# Patient Record
Sex: Female | Born: 1990 | Race: Black or African American | Hispanic: No | Marital: Married | Smoking: Never smoker
Health system: Southern US, Community
[De-identification: ages and names within clinical notes are randomized; demographics above are authoritative.]

## PROBLEM LIST (undated history)

## (undated) DIAGNOSIS — N12 Tubulo-interstitial nephritis, not specified as acute or chronic: Secondary | ICD-10-CM

## (undated) HISTORY — PX: NO PAST SURGERIES: SHX2092

## (undated) HISTORY — DX: Tubulo-interstitial nephritis, not specified as acute or chronic: N12

---

## 2015-12-27 DIAGNOSIS — A6000 Herpesviral infection of urogenital system, unspecified: Secondary | ICD-10-CM | POA: Insufficient documentation

## 2015-12-27 LAB — HM PAP SMEAR: HM Pap smear: NEGATIVE

## 2016-01-03 LAB — HEPATIC FUNCTION PANEL
ALK PHOS: 97 U/L (ref 25–125)
ALT: 24 U/L (ref 7–35)
AST: 19 U/L (ref 13–35)
BILIRUBIN, TOTAL: 0.5 mg/dL

## 2016-01-03 LAB — BASIC METABOLIC PANEL
BUN: 7 mg/dL (ref 4–21)
CREATININE: 0.8 mg/dL (ref 0.5–1.1)
Glucose: 89 mg/dL
POTASSIUM: 4.3 mmol/L (ref 3.4–5.3)
Sodium: 138 mmol/L (ref 137–147)

## 2016-01-03 LAB — CBC AND DIFFERENTIAL
HEMATOCRIT: 41 % (ref 36–46)
HEMOGLOBIN: 13.8 g/dL (ref 12.0–16.0)
Platelets: 323 10*3/uL (ref 150–399)
WBC: 8.7 10*3/mL

## 2016-01-03 LAB — LIPID PANEL
CHOLESTEROL: 223 mg/dL — AB (ref 0–200)
HDL: 55 mg/dL (ref 35–70)
LDL Cholesterol: 151 mg/dL
TRIGLYCERIDES: 84 mg/dL (ref 40–160)

## 2016-01-03 LAB — TSH: TSH: 0.76 u[IU]/mL (ref 0.41–5.90)

## 2017-01-23 ENCOUNTER — Telehealth: Payer: Self-pay | Admitting: *Deleted

## 2017-01-23 ENCOUNTER — Encounter: Payer: Self-pay | Admitting: *Deleted

## 2017-01-23 NOTE — Telephone Encounter (Signed)
PreVisit call completed. Pt states she would like to discuss increase in fatigue, dry skin, mood changes, increased appetite and a 20-30 lb weight gain. Pt is taking birth control, she will bring in medicine bottle tomorrow. She is not interested in receiving the flu shot. Next pap smear in 02/07/2017.

## 2017-01-24 ENCOUNTER — Ambulatory Visit (INDEPENDENT_AMBULATORY_CARE_PROVIDER_SITE_OTHER): Payer: Commercial Managed Care - HMO | Admitting: Physician Assistant

## 2017-01-24 ENCOUNTER — Encounter: Payer: Self-pay | Admitting: Physician Assistant

## 2017-01-24 VITALS — BP 110/76 | HR 70 | Temp 98.6°F | Ht 64.0 in | Wt 229.4 lb

## 2017-01-24 DIAGNOSIS — Z114 Encounter for screening for human immunodeficiency virus [HIV]: Secondary | ICD-10-CM | POA: Diagnosis not present

## 2017-01-24 DIAGNOSIS — Z23 Encounter for immunization: Secondary | ICD-10-CM | POA: Diagnosis not present

## 2017-01-24 DIAGNOSIS — R5383 Other fatigue: Secondary | ICD-10-CM

## 2017-01-24 LAB — CBC WITH DIFFERENTIAL/PLATELET
Basophils Absolute: 0 10*3/uL (ref 0.0–0.1)
Basophils Relative: 0.3 % (ref 0.0–3.0)
EOS PCT: 2.1 % (ref 0.0–5.0)
Eosinophils Absolute: 0.2 10*3/uL (ref 0.0–0.7)
HEMATOCRIT: 41.7 % (ref 36.0–46.0)
Hemoglobin: 13.8 g/dL (ref 12.0–15.0)
LYMPHS ABS: 2.8 10*3/uL (ref 0.7–4.0)
Lymphocytes Relative: 34.6 % (ref 12.0–46.0)
MCHC: 33.1 g/dL (ref 30.0–36.0)
MCV: 85.3 fl (ref 78.0–100.0)
MONOS PCT: 6.8 % (ref 3.0–12.0)
Monocytes Absolute: 0.5 10*3/uL (ref 0.1–1.0)
NEUTROS ABS: 4.5 10*3/uL (ref 1.4–7.7)
NEUTROS PCT: 56.2 % (ref 43.0–77.0)
PLATELETS: 336 10*3/uL (ref 150.0–400.0)
RBC: 4.89 Mil/uL (ref 3.87–5.11)
RDW: 13.1 % (ref 11.5–15.5)
WBC: 8 10*3/uL (ref 4.0–10.5)

## 2017-01-24 LAB — COMPREHENSIVE METABOLIC PANEL
ALK PHOS: 80 U/L (ref 39–117)
ALT: 20 U/L (ref 0–35)
AST: 15 U/L (ref 0–37)
Albumin: 3.8 g/dL (ref 3.5–5.2)
BILIRUBIN TOTAL: 0.5 mg/dL (ref 0.2–1.2)
BUN: 12 mg/dL (ref 6–23)
CALCIUM: 9.4 mg/dL (ref 8.4–10.5)
CO2: 29 mEq/L (ref 19–32)
Chloride: 104 mEq/L (ref 96–112)
Creatinine, Ser: 0.77 mg/dL (ref 0.40–1.20)
GFR: 115.89 mL/min (ref >60.00)
GLUCOSE: 83 mg/dL (ref 70–99)
POTASSIUM: 3.9 meq/L (ref 3.5–5.1)
Sodium: 137 mEq/L (ref 135–145)
TOTAL PROTEIN: 7.5 g/dL (ref 6.0–8.3)

## 2017-01-24 LAB — SEDIMENTATION RATE: SED RATE: 12 mm/h (ref 0–20)

## 2017-01-24 LAB — T4, FREE: Free T4: 0.79 ng/dL (ref 0.60–1.60)

## 2017-01-24 LAB — VITAMIN B12: VITAMIN B 12: 886 pg/mL (ref 211–911)

## 2017-01-24 LAB — POCT URINE PREGNANCY: PREG TEST UR: NEGATIVE

## 2017-01-24 LAB — VITAMIN D 25 HYDROXY (VIT D DEFICIENCY, FRACTURES): VITD: 28.54 ng/mL — ABNORMAL LOW (ref 30.00–100.00)

## 2017-01-24 LAB — TSH: TSH: 0.91 u[IU]/mL (ref 0.35–4.50)

## 2017-01-24 NOTE — Patient Instructions (Signed)
It was great meeting you today!  Please complete your lab work. We will call you with your lab results.   Fatigue Fatigue is feeling tired all of the time, a lack of energy, or a lack of motivation. Occasional or mild fatigue is often a normal response to activity or life in general. However, long-lasting (chronic) or extreme fatigue may indicate an underlying medical condition. Follow these instructions at home: Watch your fatigue for any changes. The following actions may help to lessen any discomfort you are feeling:  Talk to your health care provider about how much sleep you need each night. Try to get the required amount every night.  Take medicines only as directed by your health care provider.  Eat a healthy and nutritious diet. Ask your health care provider if you need help changing your diet.  Drink enough fluid to keep your urine clear or pale yellow.  Practice ways of relaxing, such as yoga, meditation, massage therapy, or acupuncture.  Exercise regularly.  Change situations that cause you stress. Try to keep your work and personal routine reasonable.  Do not abuse illegal drugs.  Limit alcohol intake to no more than 1 drink per day for nonpregnant women and 2 drinks per day for men. One drink equals 12 ounces of beer, 5 ounces of wine, or 1 ounces of hard liquor.  Take a multivitamin, if directed by your health care provider. Contact a health care provider if:  Your fatigue does not get better.  You have a fever.  You have unintentional weight loss or gain.  You have headaches.  You have difficulty:  Falling asleep.  Sleeping throughout the night.  You feel angry, guilty, anxious, or sad.  You are unable to have a bowel movement (constipation).  You skin is dry.  Your legs or another part of your body is swollen. Get help right away if:  You feel confused.  Your vision is blurry.  You feel faint or pass out.  You have a severe headache.  You  have severe abdominal, pelvic, or back pain.  You have chest pain, shortness of breath, or an irregular or fast heartbeat.  You are unable to urinate or you urinate less than normal.  You develop abnormal bleeding, such as bleeding from the rectum, vagina, nose, lungs, or nipples.  You vomit blood.  You have thoughts about harming yourself or committing suicide.  You are worried that you might harm someone else. This information is not intended to replace advice given to you by your health care provider. Make sure you discuss any questions you have with your health care provider. Document Released: 09/02/2007 Document Revised: 04/12/2016 Document Reviewed: 03/09/2014 Elsevier Interactive Patient Education  2017 [employer]

## 2017-01-24 NOTE — Progress Notes (Signed)
Pre visit review using our clinic review tool, if applicable. No additional management support is needed unless otherwise documented below in the visit note. 

## 2017-01-24 NOTE — Progress Notes (Signed)
Subjective:    Patient ID: Kelsey Curtis, female    DOB: Mar 29, 1991, 26 y.o.   MRN: [id number]  HPI  Kelsey Curtis is a 26 y/o female who presents to clinic today to establish care.  Acute Concerns: Fatigue - Over the past 2 months, patient has had increased fatigue, dry skin, mood changes, increased appetite, and changes in her period. She has been getting adequate sleep, which is 6-7 hours per night for her. She denies any snoring or concerns for sleep apnea. Her caffeine intake is very limited and she does not drink any coffee or sodas with caffeine does have occasional chocolate. For the past 2 months her periods have been extremely light requiring only use a panty liner and lasts only 2-3 days at this point. She has a history of having heavy and painful periods however she has been on Junel birth control for 7 or 8 years and this has relieved this. She did a pregnancy test at home yesterday and it was negative. She does work full-time for the [employer] and is in school to become a Social worker. She denies depression however she does endorse significant anxiety with her work life responsibilities including her job and school. Her diet is well balanced she needs MRI of foods, and drinks at least 324 ounces water a day. She has noticed that her skin is very dry despite ongoing application of lotion. She and her fianc has noticed that over the past 2 months she has been emotionally labile including increased tearfulness and irritability. She has a history of low back pain and this has been treated in the past by her chiropractor. She says she has ongoing diffuse body aches. She has had weight gain of approximately 30 pounds since 1 year. She denies any changes in heat or cold intolerance. She is due to see her gynecologist at the end of the month. She does endorse frequent urination however she took this to being well hydrated.  Review of Systems  See HPI  Past Medical History:  Diagnosis  Date  . Genital herpes 2015   Pt has been on Acyclovir     Social History   Social History  . Marital status: Single    Spouse name: N/A  . Number of children: N/A  . Years of education: N/A   Occupational History  . Not on file.   Social History Main Topics  . Smoking status: Never Smoker  . Smokeless tobacco: Never Used  . Alcohol use Yes     Comment: Occ.  . Drug use: No  . Sexual activity: Yes    Birth control/ protection: Pill   Other Topics Concern  . 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 No pertinent surgical history.  Family History  Problem Relation Age of Onset  . Alzheimer's disease Maternal Grandmother     No Known Allergies  No current outpatient prescriptions on file prior to visit.   No current facility-administered medications on file prior to visit.     BP 110/76 (BP Location: Left Arm, Patient Position: Sitting, Cuff Size: Large)   Pulse 70   Temp 98.6 F (37 C) (Oral)   Ht 5' 4"  (1.626 m)   Wt 229 lb 6.1 oz (104 kg)  LMP 01/22/2017   SpO2 99%   BMI 39.37 kg/m      Objective:   Physical Exam  Constitutional: She appears well-developed and well-nourished. She is cooperative.  Non-toxic appearance. She does not have a sickly appearance. She does not appear ill. No distress.  HENT:  Head: Normocephalic and atraumatic.  Neck: Trachea normal and normal range of motion. No thyroid mass present.  Cardiovascular: Normal rate, regular rhythm and normal heart sounds.   Pulmonary/Chest: Effort normal and breath sounds normal. No accessory muscle usage. No respiratory distress.  Neurological: She is alert.  Skin: Skin is warm, dry and intact.  Psychiatric: She has a normal mood and affect. Her speech is normal.  Nursing note and vitals  reviewed.  Results for orders placed or performed in visit on 01/24/17  POCT urine pregnancy  Result Value Ref Range   Preg Test, Ur Negative Negative      Assessment & Plan:  1. Fatigue, unspecified type Urine pregnancy test negative. Order routine labs to check for organic causes of fatigue. I also encouraged patient to follow up with her OB/GYN at her regularly scheduled annual visit at the end of this month regarding these issues for further evaluation and treatment. - TSH - T4, free - CBC with Differential/Platelet - Comprehensive metabolic panel - VITAMIN D 25 Hydroxy (Vit-D Deficiency, Fractures) - POCT urine pregnancy - Sed Rate (ESR) - Vitamin B12  2. Encounter for screening for HIV Patient provided verbal consent for this one-time screening today. - HIV antibody (with reflex)  3. Need for prophylactic vaccination with combined diphtheria-tetanus-pertussis (DTP) vaccine Tdap administered today. - Tdap vaccine greater than or equal to 7yo IM  [employer] PA-C 01/24/17

## 2017-01-24 NOTE — Telephone Encounter (Signed)
Noted  

## 2017-01-25 LAB — HIV ANTIBODY (ROUTINE TESTING W REFLEX): HIV 1&2 Ab, 4th Generation: NONREACTIVE

## 2017-01-31 ENCOUNTER — Encounter: Payer: Self-pay | Admitting: Physician Assistant

## 2017-01-31 DIAGNOSIS — A6000 Herpesviral infection of urogenital system, unspecified: Secondary | ICD-10-CM

## 2017-01-31 LAB — CHLAMYDIA/GC NAA, CONFIRMATION
CHLAMYDIA BY NAA: NEGATIVE
Neisseria gonorrhoeae, NAA: NEGATIVE

## 2017-09-19 ENCOUNTER — Encounter: Payer: Self-pay | Admitting: Physician Assistant

## 2017-09-19 ENCOUNTER — Ambulatory Visit (INDEPENDENT_AMBULATORY_CARE_PROVIDER_SITE_OTHER): Payer: Commercial Managed Care - HMO | Admitting: Physician Assistant

## 2017-09-19 VITALS — BP 120/74 | HR 74 | Temp 98.2°F | Ht 64.0 in | Wt 217.5 lb

## 2017-09-19 DIAGNOSIS — E669 Obesity, unspecified: Secondary | ICD-10-CM

## 2017-09-19 DIAGNOSIS — Z136 Encounter for screening for cardiovascular disorders: Secondary | ICD-10-CM | POA: Diagnosis not present

## 2017-09-19 DIAGNOSIS — Z13220 Encounter for screening for lipoid disorders: Secondary | ICD-10-CM | POA: Diagnosis not present

## 2017-09-19 DIAGNOSIS — K5900 Constipation, unspecified: Secondary | ICD-10-CM

## 2017-09-19 DIAGNOSIS — M545 Low back pain, unspecified: Secondary | ICD-10-CM

## 2017-09-19 DIAGNOSIS — Z0001 Encounter for general adult medical examination with abnormal findings: Secondary | ICD-10-CM

## 2017-09-19 DIAGNOSIS — E559 Vitamin D deficiency, unspecified: Secondary | ICD-10-CM | POA: Diagnosis not present

## 2017-09-19 DIAGNOSIS — R350 Frequency of micturition: Secondary | ICD-10-CM | POA: Diagnosis not present

## 2017-09-19 LAB — LIPID PANEL
CHOL/HDL RATIO: 4
Cholesterol: 202 mg/dL — ABNORMAL HIGH (ref 0–200)
HDL: 56.1 mg/dL (ref >39.00)
LDL CALC: 131 mg/dL — AB (ref 0–99)
NONHDL: 145.74
Triglycerides: 75 mg/dL (ref 0.0–149.0)
VLDL: 15 mg/dL (ref 0.0–40.0)

## 2017-09-19 LAB — COMPREHENSIVE METABOLIC PANEL
ALT: 15 U/L (ref 0–35)
AST: 10 U/L (ref 0–37)
Albumin: 3.6 g/dL (ref 3.5–5.2)
Alkaline Phosphatase: 62 U/L (ref 39–117)
BILIRUBIN TOTAL: 0.4 mg/dL (ref 0.2–1.2)
BUN: 14 mg/dL (ref 6–23)
CO2: 30 meq/L (ref 19–32)
CREATININE: 0.79 mg/dL (ref 0.40–1.20)
Calcium: 8.9 mg/dL (ref 8.4–10.5)
Chloride: 106 mEq/L (ref 96–112)
GFR: 111.97 mL/min (ref >60.00)
GLUCOSE: 82 mg/dL (ref 70–99)
Potassium: 4.5 mEq/L (ref 3.5–5.1)
SODIUM: 140 meq/L (ref 135–145)
Total Protein: 6.8 g/dL (ref 6.0–8.3)

## 2017-09-19 LAB — POCT URINALYSIS DIPSTICK
Bilirubin, UA: NEGATIVE
Blood, UA: NEGATIVE
Glucose, UA: NEGATIVE
KETONES UA: NEGATIVE
Nitrite, UA: NEGATIVE
PH UA: 6 (ref 5.0–8.0)
SPEC GRAV UA: 1.025 (ref 1.010–1.025)
Urobilinogen, UA: 1 E.U./dL

## 2017-09-19 LAB — CBC WITH DIFFERENTIAL/PLATELET
BASOS ABS: 0 10*3/uL (ref 0.0–0.1)
Basophils Relative: 0.2 % (ref 0.0–3.0)
EOS ABS: 0.2 10*3/uL (ref 0.0–0.7)
Eosinophils Relative: 1.6 % (ref 0.0–5.0)
HCT: 42.9 % (ref 36.0–46.0)
Hemoglobin: 14 g/dL (ref 12.0–15.0)
LYMPHS ABS: 3.1 10*3/uL (ref 0.7–4.0)
LYMPHS PCT: 31 % (ref 12.0–46.0)
MCHC: 32.5 g/dL (ref 30.0–36.0)
MCV: 87.3 fl (ref 78.0–100.0)
MONO ABS: 0.7 10*3/uL (ref 0.1–1.0)
Monocytes Relative: 7.2 % (ref 3.0–12.0)
NEUTROS ABS: 6 10*3/uL (ref 1.4–7.7)
NEUTROS PCT: 60 % (ref 43.0–77.0)
PLATELETS: 315 10*3/uL (ref 150.0–400.0)
RBC: 4.92 Mil/uL (ref 3.87–5.11)
RDW: 13.2 % (ref 11.5–15.5)
WBC: 10 10*3/uL (ref 4.0–10.5)

## 2017-09-19 LAB — HEMOGLOBIN A1C: Hgb A1c MFr Bld: 5.3 % (ref 4.6–6.5)

## 2017-09-19 LAB — TSH: TSH: 1.64 u[IU]/mL (ref 0.35–4.50)

## 2017-09-19 LAB — VITAMIN D 25 HYDROXY (VIT D DEFICIENCY, FRACTURES): VITD: 22.05 ng/mL — AB (ref 30.00–100.00)

## 2017-09-19 LAB — POCT URINE PREGNANCY: Preg Test, Ur: NEGATIVE

## 2017-09-19 MED ORDER — NITROFURANTOIN MONOHYD MACRO 100 MG PO CAPS: 100.0000 mg | ORAL_CAPSULE | Freq: Two times a day (BID) | ORAL | 0 refills | Status: DC

## 2017-09-19 NOTE — Progress Notes (Signed)
I acted as a Neurosurgeon for [employer], PA-C Corky Mull, LPN  Subjective:    Kelsey Curtis is a 26 y.o. female and is here for a comprehensive physical exam.  HPI  There are no preventive care reminders to display for this patient.  Acute Concerns: Frequency of urination and low back pain -- "sharp pin and needle pain" in her L side that started a few days ago, feels similar to when she had kidney infection in July, having some nausea/vomiting/chills.   Chronic Issues: Obesity -- she is in school and works full time, has very limited time for exercise, however when she works she is on her feet for 12 hours a day. She has lost about 12 lb since I last saw her in March. Does have a family hx of DM in grandmother and aunt. Constipation -- deals with this on a intermittent basis. Denies blood in stool, last BM yesterday, has occasional hard stools, has not noticed any blood in stools or hemorrhoids, no pain with eating, appetite is slightly diminished. Does take prn laxatives that do provide relief. Vit D deficiency -- last checked in March and was 28, she took Vit D supplement for a while after dx  Health Maintenance: Immunizations -- denied flu shot Colonoscopy -- no family hx of colon cancer, start at age 35  Mammogram -- no family hx of breast cancer, start at age 70 PAP -- sees Ob-Gyn, requesting records Diet -- normally has a really good appetite (has been diminished with recent urinary pain) Sleep habits -- "it's okay" tries to get at least 6 hours Exercise -- none, but does have a very active job Weight -- Weight: 217 lb 8 oz (98.7 kg) ; was 229 lb  Mood -- no issues with mood disorder Last period -- Patient's last menstrual period was 09/10/2017. Period characteristics -- very light, 2-3 days; no pain Birth control -- currently on Junel FE  Depression screen PHQ 2/9 09/19/2017  Decreased Interest 0  Down, Depressed, Hopeless 0  PHQ - 2 Score 0   Other  providers/specialists: Ob-Gyn --> [facility]?   PMHx, SurgHx, SocialHx, Medications, and Allergies were reviewed in the Visit Navigator and updated as appropriate.   No past medical history on file.  No past surgical history on file.   Family History  Problem Relation Age of Onset  . Alzheimer's disease Maternal Grandmother   . Colon cancer Neg Hx   . Breast cancer Neg Hx     Social History  Substance Use Topics  . Smoking status: Never Smoker  . Smokeless tobacco: Never Used  . Alcohol use Yes     Comment: Occ.    Review of Systems:   Review of Systems  Constitutional: Negative for chills, fever, malaise/fatigue and weight loss.  HENT: Negative for hearing loss, sinus pain and sore throat.   Respiratory: Negative for cough and hemoptysis.   Cardiovascular: Negative for chest pain, palpitations, leg swelling and PND.  Gastrointestinal: Positive for constipation. Negative for abdominal pain, diarrhea, heartburn, nausea and vomiting.  Genitourinary: Positive for frequency. Negative for dysuria and urgency.  Musculoskeletal: Positive for back pain. Negative for myalgias and neck pain.  Skin: Negative for itching and rash.  Neurological: Negative for dizziness, tingling, seizures and headaches.  Endo/Heme/Allergies: Negative for polydipsia.  Psychiatric/Behavioral: Negative for depression. The patient is not nervous/anxious.       Objective:   BP 120/74 (BP Location: Left Arm, Patient Position: Sitting, Cuff Size: Large)  Pulse 74   Temp 98.2 F (36.8 C) (Oral)   Ht 5\' 4"  (1.626 m)   Wt 217 lb 8 oz (98.7 kg)   LMP 09/10/2017   SpO2 98%   BMI 37.33 kg/m   General Appearance:    Alert, cooperative, no distress, appears stated age  Head:    Normocephalic, without obvious abnormality, atraumatic  Eyes:    PERRL, conjunctiva/corneas clear, EOM's intact, fundi    benign, both eyes  Ears:    Normal TM's and external ear canals, both ears  Nose:   Nares normal,  septum midline, mucosa normal, no drainage    or sinus tenderness  Throat:   Lips, mucosa, and tongue normal; teeth and gums normal  Neck:   Supple, symmetrical, trachea midline, no adenopathy;    thyroid:  no enlargement/tenderness/nodules; no carotid   bruit or JVD  Back:     Symmetric, no curvature, ROM normal, no CVA tenderness  Lungs:     Clear to auscultation bilaterally, respirations unlabored  Chest Wall:    No tenderness or deformity   Heart:    Regular rate and rhythm, S1 and S2 normal, no murmur, rub   or gallop  Breast Exam:   Deferred -- sees Ob-Gyn  Abdomen:     Soft, non-tender, bowel sounds active all four quadrants,    no masses, no organomegaly; no rebound or guarding present; no tenderness with deep palpation, no CVA tenderness  Genitalia:    Deferred -- sees Ob-Gyn  Rectal:    Deferred -- sees Ob-Gyn  Extremities:   Extremities normal, atraumatic, no cyanosis or edema  Pulses:   2+ and symmetric all extremities  Skin:   Skin color, texture, turgor normal, no rashes or lesions  Lymph nodes:   Cervical, supraclavicular, and axillary nodes normal  Neurologic:   CNII-XII intact, normal strength, sensation and reflexes    throughout   Results for orders placed or performed in visit on 09/19/17  POCT urinalysis dipstick  Result Value Ref Range   Color, UA Yellow    Clarity, UA Clear    Glucose, UA Negative    Bilirubin, UA Negative    Ketones, UA Negative    Spec Grav, UA 1.025 1.010 - 1.025   Blood, UA Negative    pH, UA 6.0 5.0 - 8.0   Protein, UA 15 mg/dL    Urobilinogen, UA 1.0 0.2 or 1.0 E.U./dL   Nitrite, UA Negative    Leukocytes, UA Trace (A) Negative  POCT urine pregnancy  Result Value Ref Range   Preg Test, Ur Negative Negative    Assessment/Plan:   [name] was seen today for annual exam and llq and low back pain left side.  Diagnoses and all orders for this visit:  Encounter for general adult medical examination with abnormal findings Today  patient counseled on age appropriate routine health concerns for screening and prevention, each reviewed and up to date or declined. Immunizations reviewed and up to date or declined. Labs ordered and reviewed. Risk factors for depression reviewed and negative. Hearing function and visual acuity are intact. ADLs screened and addressed as needed. Functional ability and level of safety reviewed and appropriate. Education, counseling and referrals performed based on assessed risks today. Patient provided with a copy of personalized plan for preventive services. -     CBC with Differential/Platelet -     Comprehensive metabolic panel  Frequency of urination and Left-sided low back pain without sciatica, unspecified chronicity UA with trace  bacteria. Urine pregnancy is negative. Start Macrobid until culture returns. Hx of recent pyelo. Exam benign. I do suspect that her constipation is playing a role in these symptoms. Advised that patient follow-up if symptoms worsen or persist despite treatment. -     POCT urinalysis dipstick -     POCT urine pregnancy -     Urine Culture  Obesity, unspecified classification, unspecified obesity type, unspecified whether serious comorbidity present Check A1c given obesity and fam hx DM. Check TSH as well. She has limited time to exercise, encouraged as able. Work on hydration and increasing fiber via fresh fruits/veggies. -     Hemoglobin A1c -     TSH  Constipation, unspecified constipation type Recommend Miralax daily to help soften stools. We discussed water intake and fresh fruits/veggies. -     TSH  Encounter for lipid screening for cardiovascular disease -     Lipid panel  Vitamin D deficiency Has been taking supplement for a bit, but recently stopped, will re-check level today. -     VITAMIN D 25 Hydroxy (Vit-D Deficiency, Fractures)  Other orders -     nitrofurantoin, macrocrystal-monohydrate, (MACROBID) 100 MG capsule; Take 1 capsule (100 mg total)  by mouth 2 (two) times daily.    Well Adult Exam: Labs ordered: Yes. Patient counseling was done. See below for items discussed. Discussed the patient's BMI. The BMI BMI is not in the acceptable range; BMI management plan is completed Follow up as needed for acute illness.  Patient Counseling:   [x]     Nutrition: Stressed importance of moderation in sodium/caffeine intake, saturated fat and cholesterol, caloric balance, sufficient intake of fresh fruits, vegetables, fiber, calcium, iron, and 1 mg of folate supplement per day (for females capable of pregnancy).   [x]      Stressed the importance of regular exercise.    [x]     Substance Abuse: Discussed cessation/primary prevention of tobacco, alcohol, or other drug use; driving or other dangerous activities under the influence; availability of treatment for abuse.    [x]      Injury prevention: Discussed safety belts, safety helmets, smoke detector, smoking near bedding or upholstery.    [x]      Sexuality: Discussed sexually transmitted diseases, partner selection, use of condoms, avoidance of unintended pregnancy  and contraceptive alternatives.    [x]     Dental health: Discussed importance of regular tooth brushing, flossing, and dental visits.   [x]      Health maintenance and immunizations reviewed. Please refer to Health maintenance section.   CMA or LPN served as scribe during this visit. History, Physical, and Plan performed by medical provider. Documentation and orders reviewed and attested to.  Jarold MottoSamantha Lon Klippel, PA-C [facility]

## 2017-09-19 NOTE — Patient Instructions (Addendum)
It was great to see you!  Start the antibiotic to cover for your early UTI, I will notify you of the culture results when they return. If your pain worsens in any way, please seek medical attention.  Work on getting more fiber and water in [employer]. Consider taking 1 dose of Miralax daily to help with getting regular.  Health Maintenance, Female Adopting a healthy lifestyle and getting preventive care can go a long way to promote health and wellness. Talk with your health care provider about what schedule of regular examinations is right for you. This is a good chance for you to check in with your provider about disease prevention and staying healthy. In between checkups, there are plenty of things you can do on your own. Experts have done a lot of research about which lifestyle changes and preventive measures are most likely to keep you healthy. Ask your health care provider for more information. Weight and diet Eat a healthy diet  Be sure to include plenty of vegetables, fruits, low-fat dairy products, and lean protein.  Do not eat a lot of foods high in solid fats, added sugars, or salt.  Get regular exercise. This is one of the most important things you can do for your health. ? Most adults should exercise for at least 150 minutes each week. The exercise should increase your heart rate and make you sweat (moderate-intensity exercise). ? Most adults should also do strengthening exercises at least twice a week. This is in addition to the moderate-intensity exercise.  Maintain a healthy weight  Body mass index (BMI) is a measurement that can be used to identify possible weight problems. It estimates body fat based on height and weight. Your health care provider can help determine your BMI and help you achieve or maintain a healthy weight.  For females 16 years of age and older: ? A BMI below 18.5 is considered underweight. ? A BMI of 18.5 to 24.9 is normal. ? A BMI of 25 to 29.9 is  considered overweight. ? A BMI of 30 and above is considered obese.  Watch levels of cholesterol and blood lipids  You should start having your blood tested for lipids and cholesterol at 27 years of age, then have this test every 5 years.  You may need to have your cholesterol levels checked more often if: ? Your lipid or cholesterol levels are high. ? You are older than 27 years of age. ? You are at high risk for heart disease.  Cancer screening Lung Cancer  Lung cancer screening is recommended for adults 64-94 years old who are at high risk for lung cancer because of a history of smoking.  A yearly low-dose CT scan of the lungs is recommended for people who: ? Currently smoke. ? Have quit within the past 15 years. ? Have at least a 30-pack-year history of smoking. A pack year is smoking an average of one pack of cigarettes a day for 1 year.  Yearly screening should continue until it has been 15 years since you quit.  Yearly screening should stop if you develop a health problem that would prevent you from having lung cancer treatment.  Breast Cancer  Practice breast self-awareness. This means understanding how your breasts normally appear and feel.  It also means doing regular breast self-exams. Let your health care provider know about any changes, no matter how small.  If you are in your 20s or 30s, you should have a clinical breast exam (CBE)  by a health care provider every 1-3 years as part of a regular health exam.  If you are 40 or older, have a CBE every year. Also consider having a breast X-ray (mammogram) every year.  If you have a family history of breast cancer, talk to your health care provider about genetic screening.  If you are at high risk for breast cancer, talk to your health care provider about having an MRI and a mammogram every year.  Breast cancer gene (BRCA) assessment is recommended for women who have family members with BRCA-related cancers.  BRCA-related cancers include: ? Breast. ? Ovarian. ? Tubal. ? Peritoneal cancers.  Results of the assessment will determine the need for genetic counseling and BRCA1 and BRCA2 testing.  Cervical Cancer Your health care provider may recommend that you be screened regularly for cancer of the pelvic organs (ovaries, uterus, and vagina). This screening involves a pelvic examination, including checking for microscopic changes to the surface of your cervix (Pap test). You may be encouraged to have this screening done every 3 years, beginning at age 21.  For women ages 30-65, health care providers may recommend pelvic exams and Pap testing every 3 years, or they may recommend the Pap and pelvic exam, combined with testing for human papilloma virus (HPV), every 5 years. Some types of HPV increase your risk of cervical cancer. Testing for HPV may also be done on women of any age with unclear Pap test results.  Other health care providers may not recommend any screening for nonpregnant women who are considered low risk for pelvic cancer and who do not have symptoms. Ask your health care provider if a screening pelvic exam is right for you.  If you have had past treatment for cervical cancer or a condition that could lead to cancer, you need Pap tests and screening for cancer for at least 20 years after your treatment. If Pap tests have been discontinued, your risk factors (such as having a new sexual partner) need to be reassessed to determine if screening should resume. Some women have medical problems that increase the chance of getting cervical cancer. In these cases, your health care provider may recommend more frequent screening and Pap tests.  Colorectal Cancer  This type of cancer can be detected and often prevented.  Routine colorectal cancer screening usually begins at 27 years of age and continues through 27 years of age.  Your health care provider may recommend screening at an earlier age if  you have risk factors for colon cancer.  Your health care provider may also recommend using home test kits to check for hidden blood in the stool.  A small camera at the end of a tube can be used to examine your colon directly (sigmoidoscopy or colonoscopy). This is done to check for the earliest forms of colorectal cancer.  Routine screening usually begins at age 50.  Direct examination of the colon should be repeated every 5-10 years through 27 years of age. However, you may need to be screened more often if early forms of precancerous polyps or small growths are found.  Skin Cancer  Check your skin from head to toe regularly.  Tell your health care provider about any new moles or changes in moles, especially if there is a change in a mole's shape or color.  Also tell your health care provider if you have a mole that is larger than the size of a pencil eraser.  Always use sunscreen. Apply sunscreen liberally and   repeatedly throughout the day.  Protect yourself by wearing long sleeves, pants, a wide-brimmed hat, and sunglasses whenever you are outside.  Heart disease, diabetes, and high blood pressure  High blood pressure causes heart disease and increases the risk of stroke. High blood pressure is more likely to develop in: ? People who have blood pressure in the high end of the normal range (130-139/85-89 mm Hg). ? People who are overweight or obese. ? People who are African American.  If you are 18-39 years of age, have your blood pressure checked every 3-5 years. If you are 40 years of age or older, have your blood pressure checked every year. You should have your blood pressure measured twice-once when you are at a hospital or clinic, and once when you are not at a hospital or clinic. Record the average of the two measurements. To check your blood pressure when you are not at a hospital or clinic, you can use: ? An automated blood pressure machine at a pharmacy. ? A home blood  pressure monitor.  If you are between 55 years and 79 years old, ask your health care provider if you should take aspirin to prevent strokes.  Have regular diabetes screenings. This involves taking a blood sample to check your fasting blood sugar level. ? If you are at a normal weight and have a low risk for diabetes, have this test once every three years after 27 years of age. ? If you are overweight and have a high risk for diabetes, consider being tested at a younger age or more often. Preventing infection Hepatitis B  If you have a higher risk for hepatitis B, you should be screened for this virus. You are considered at high risk for hepatitis B if: ? You were born in a country where hepatitis B is common. Ask your health care provider which countries are considered high risk. ? Your parents were born in a high-risk country, and you have not been immunized against hepatitis B (hepatitis B vaccine). ? You have HIV or AIDS. ? You use needles to inject street drugs. ? You live with someone who has hepatitis B. ? You have had sex with someone who has hepatitis B. ? You get hemodialysis treatment. ? You take certain medicines for conditions, including cancer, organ transplantation, and autoimmune conditions.  Hepatitis C  Blood testing is recommended for: ? Everyone born from 1945 through 1965. ? Anyone with known risk factors for hepatitis C.  Sexually transmitted infections (STIs)  You should be screened for sexually transmitted infections (STIs) including gonorrhea and chlamydia if: ? You are sexually active and are younger than 27 years of age. ? You are older than 27 years of age and your health care provider tells you that you are at risk for this type of infection. ? Your sexual activity has changed since you were last screened and you are at an increased risk for chlamydia or gonorrhea. Ask your health care provider if you are at risk.  If you do not have HIV, but are at risk,  it may be recommended that you take a prescription medicine daily to prevent HIV infection. This is called pre-exposure prophylaxis (PrEP). You are considered at risk if: ? You are sexually active and do not regularly use condoms or know the HIV status of your partner(s). ? You take drugs by injection. ? You are sexually active with a partner who has HIV.  Talk with your health care provider about whether you   are at high risk of being infected with HIV. If you choose to begin PrEP, you should first be tested for HIV. You should then be tested every 3 months for as long as you are taking PrEP. Pregnancy  If you are premenopausal and you may become pregnant, ask your health care provider about preconception counseling.  If you may become pregnant, take 400 to 800 micrograms (mcg) of folic acid every day.  If you want to prevent pregnancy, talk to your health care provider about birth control (contraception). Osteoporosis and menopause  Osteoporosis is a disease in which the bones lose minerals and strength with aging. This can result in serious bone fractures. Your risk for osteoporosis can be identified using a bone density scan.  If you are 56 years of age or older, or if you are at risk for osteoporosis and fractures, ask your health care provider if you should be screened.  Ask your health care provider whether you should take a calcium or vitamin D supplement to lower your risk for osteoporosis.  Menopause may have certain physical symptoms and risks.  Hormone replacement therapy may reduce some of these symptoms and risks. Talk to your health care provider about whether hormone replacement therapy is right for you. Follow these instructions at home:  Schedule regular health, dental, and eye exams.  Stay current with your immunizations.  Do not use any tobacco products including cigarettes, chewing tobacco, or electronic cigarettes.  If you are pregnant, do not drink  alcohol.  If you are breastfeeding, limit how much and how often you drink alcohol.  Limit alcohol intake to no more than 1 drink per day for nonpregnant women. One drink equals 12 ounces of beer, 5 ounces of wine, or 1 ounces of hard liquor.  Do not use street drugs.  Do not share needles.  Ask your health care provider for help if you need support or information about quitting drugs.  Tell your health care provider if you often feel depressed.  Tell your health care provider if you have ever been abused or do not feel safe at home. This information is not intended to replace advice given to you by your health care provider. Make sure you discuss any questions you have with your health care provider. Document Released: 05/21/2011 Document Revised: 04/12/2016 Document Reviewed: 08/09/2015 Elsevier Interactive Patient Education  [employer].

## 2017-09-21 LAB — URINE CULTURE
MICRO NUMBER:: 81227501
SPECIMEN QUALITY:: ADEQUATE

## 2017-09-23 ENCOUNTER — Encounter: Payer: Self-pay | Admitting: Physician Assistant

## 2017-10-04 ENCOUNTER — Other Ambulatory Visit: Payer: Self-pay

## 2017-10-04 ENCOUNTER — Encounter (HOSPITAL_BASED_OUTPATIENT_CLINIC_OR_DEPARTMENT_OTHER): Payer: Self-pay | Admitting: Emergency Medicine

## 2017-10-04 ENCOUNTER — Emergency Department (HOSPITAL_BASED_OUTPATIENT_CLINIC_OR_DEPARTMENT_OTHER)
Admission: EM | Admit: 2017-10-04 | Discharge: 2017-10-05 | Disposition: A | Discharge status: Home / Self Care | Payer: Commercial Managed Care - HMO | Attending: Emergency Medicine | Admitting: Emergency Medicine

## 2017-10-04 ENCOUNTER — Telehealth: Payer: Self-pay | Admitting: Physician Assistant

## 2017-10-04 ENCOUNTER — Other Ambulatory Visit: Payer: Self-pay | Admitting: Physician Assistant

## 2017-10-04 DIAGNOSIS — R350 Frequency of micturition: Secondary | ICD-10-CM | POA: Diagnosis present

## 2017-10-04 DIAGNOSIS — Z79899 Other long term (current) drug therapy: Secondary | ICD-10-CM | POA: Diagnosis not present

## 2017-10-04 DIAGNOSIS — N3000 Acute cystitis without hematuria: Secondary | ICD-10-CM | POA: Diagnosis not present

## 2017-10-04 LAB — URINALYSIS, ROUTINE W REFLEX MICROSCOPIC
BILIRUBIN URINE: NEGATIVE
GLUCOSE, UA: NEGATIVE mg/dL
Ketones, ur: 15 mg/dL — AB
Nitrite: POSITIVE — AB
PH: 7 (ref 5.0–8.0)
Protein, ur: 100 mg/dL — AB
SPECIFIC GRAVITY, URINE: 1.025 (ref 1.005–1.030)

## 2017-10-04 LAB — PREGNANCY, URINE: Preg Test, Ur: NEGATIVE

## 2017-10-04 LAB — URINALYSIS, MICROSCOPIC (REFLEX)

## 2017-10-04 NOTE — Telephone Encounter (Signed)
Please see refill request and advise.

## 2017-10-04 NOTE — ED Triage Notes (Signed)
PT presents with c/o urinary frequency since yesterday and pain after urination. PT recently had severe UTI

## 2017-10-04 NOTE — ED Notes (Signed)
Pt reports completing antibiotics as prescribed. Pt denies back pain or fevers which she reported she had with the last UTI.

## 2017-10-04 NOTE — Telephone Encounter (Signed)
Left message on voicemail to call office. Pt needs to schedule appt for urinary symptoms.

## 2017-10-04 NOTE — Telephone Encounter (Signed)
Please call patient and let know she needs an office visit for further evaluation of her urinary symptoms.  [name] PA-C

## 2017-10-04 NOTE — Telephone Encounter (Signed)
MEDICATION: Macrobid  PHARMACY:  [id number] [place] [address]  IS THIS A 90 DAY SUPPLY : no  IS PATIENT OUT OF MEDICATION: Yes  IF NOT; HOW MUCH IS LEFT: 0  LAST APPOINTMENT DATE: @11 /11/2016  NEXT APPOINTMENT DATE:@n /a  OTHER COMMENTS: urgency to urinate still   **Let patient know to contact pharmacy at the end of the day to make sure medication is ready. **  ** Please notify patient to allow 48-72 hours to process**  **Encourage patient to contact the pharmacy for refills or they can request refills through [facility]**

## 2017-10-04 NOTE — Telephone Encounter (Signed)
Left message on voicemail to call office. Pt needs appt for urinary symptoms.

## 2017-10-05 MED ORDER — PHENAZOPYRIDINE HCL 100 MG PO TABS
200.0000 mg | ORAL_TABLET | Freq: Once | ORAL | Status: AC
  Administered 2017-10-05: 200 mg via ORAL
  Filled 2017-10-05: qty 2

## 2017-10-05 MED ORDER — NITROFURANTOIN MONOHYD MACRO 100 MG PO CAPS
100.0000 mg | ORAL_CAPSULE | Freq: Once | ORAL | Status: AC
  Administered 2017-10-05: 100 mg via ORAL
  Filled 2017-10-05: qty 1

## 2017-10-05 MED ORDER — NITROFURANTOIN MONOHYD MACRO 100 MG PO CAPS: 100.0000 mg | ORAL_CAPSULE | Freq: Two times a day (BID) | ORAL | 0 refills | Status: DC

## 2017-10-05 MED ORDER — PHENAZOPYRIDINE HCL 200 MG PO TABS: 200.0000 mg | ORAL_TABLET | Freq: Three times a day (TID) | ORAL | 0 refills | Status: DC

## 2017-10-05 NOTE — ED Provider Notes (Signed)
[facility] EMERGENCY DEPARTMENT Provider Note   CSN: [id number] Arrival date & time: 10/04/17  2315     History   Chief Complaint No chief complaint on file.   HPI Kelsey Curtis is a 26 y.o. female.  The history is provided by the patient.  Urinary Frequency  This is a recurrent problem. The current episode started 2 days ago. The problem occurs constantly. The problem has not changed since onset.Pertinent negatives include no chest pain, no abdominal pain, no headaches and no shortness of breath. Nothing aggravates the symptoms. Nothing relieves the symptoms. She has tried nothing for the symptoms. The treatment provided no relief.    Past Medical History:  Diagnosis Date  . Pyelonephritis     Patient Active Problem List   Diagnosis Date Noted  . 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 No pertinent surgical history.  OB History    No data available       Home Medications    Prior to Admission medications   Medication Sig Start Date End Date Taking? Authorizing Provider  acyclovir (ZOVIRAX) 400 MG tablet Take 400 mg by mouth 2 (two) times daily.  12/23/16   [provider]  JUNEL FE 1/20 1-20 MG-MCG tablet  11/16/16   [provider]  naproxen sodium (ALEVE) 220 MG tablet Take 440 mg by mouth as needed.    [provider]  nitrofurantoin, macrocrystal-monohydrate, (MACROBID) 100 MG capsule Take 1 capsule (100 mg total) by mouth 2 (two) times daily. 09/19/17   Jarold MottoWorley, Samantha, PA    Family History Family History  Problem Relation Age of Onset  . Alzheimer's disease Maternal Grandmother   . Colon cancer Neg Hx   . Breast cancer Neg Hx     Social History Social History   Tobacco Use  . Smoking status: Never Smoker  . Smokeless tobacco: Never Used  Substance Use Topics  . Alcohol use: Yes    Comment: Occ.  . Drug use: No     Allergies   Patient has no known allergies.   Review of Systems Review of  Systems  Constitutional: Negative for fever.  Respiratory: Negative for shortness of breath.   Cardiovascular: Negative for chest pain.  Gastrointestinal: Negative for abdominal pain.  Genitourinary: Positive for frequency and urgency.       Hesitancy  Neurological: Negative for headaches.  All other systems reviewed and are negative.    Physical Exam Updated Vital Signs BP 118/71 (BP Location: Left Arm)   Pulse 89   Temp 98.8 F (37.1 C) (Oral)   Resp 18   LMP 09/10/2017   SpO2 100%   Physical Exam  Constitutional: She is oriented to person, place, and time. She appears well-developed and well-nourished. No distress.  HENT:  Head: Normocephalic and atraumatic.  Nose: Nose normal.  Mouth/Throat: No oropharyngeal exudate.  Eyes: Conjunctivae are normal. Pupils are equal, round, and reactive to light.  Neck: Normal range of motion. Neck supple.  Cardiovascular: Normal rate, regular rhythm, normal heart sounds and intact distal pulses.  Pulmonary/Chest: Effort normal and breath sounds normal. No stridor. She has no wheezes. She has no rales.  Abdominal: Soft. Bowel sounds are normal. She exhibits no mass. There is no tenderness. There is no rebound and no guarding.  Musculoskeletal: Normal range of motion.  Neurological: She is alert and oriented to person, place, and time. She displays normal reflexes.  Skin: Skin is warm and dry. Capillary refill takes less than 2  seconds.  Psychiatric: She has a normal mood and affect.     ED Treatments / Results  Labs (all labs ordered are listed, but only abnormal results are displayed)  Results for orders placed or performed during the hospital encounter of 10/04/17  Urinalysis, Routine w reflex microscopic  Result Value Ref Range   Color, Urine YELLOW YELLOW   APPearance CLOUDY (A) CLEAR   Specific Gravity, Urine 1.025 1.005 - 1.030   pH 7.0 5.0 - 8.0   Glucose, UA NEGATIVE NEGATIVE mg/dL   Hgb urine dipstick LARGE (A) NEGATIVE    Bilirubin Urine NEGATIVE NEGATIVE   Ketones, ur 15 (A) NEGATIVE mg/dL   Protein, ur 540100 (A) NEGATIVE mg/dL   Nitrite POSITIVE (A) NEGATIVE   Leukocytes, UA LARGE (A) NEGATIVE  Pregnancy, urine  Result Value Ref Range   Preg Test, Ur NEGATIVE NEGATIVE  Urinalysis, Microscopic (reflex)  Result Value Ref Range   RBC / HPF 6-30 0 - 5 RBC/hpf   WBC, UA TOO NUMEROUS TO COUNT 0 - 5 WBC/hpf   Bacteria, UA MANY (A) NONE SEEN   Squamous Epithelial / LPF 6-30 (A) NONE SEEN   No results found.   Radiology No results found.  Procedures Procedures (including critical care time)  Medications Ordered in ED Medications  nitrofurantoin (macrocrystal-monohydrate) (MACROBID) capsule 100 mg (not administered)  phenazopyridine (PYRIDIUM) tablet 200 mg (not administered)      Final Clinical Impressions(s) / ED Diagnoses    Strict return precautions for fever, global weakness, blood in the urine, abdominal distention, vomiting, no drainage from the foley catheter, swelling or the lips or tongue, chest pain, dyspnea on exertion, new weakness or numbness changes in vision or speech, fevers, weakness persistent pain, Inability to tolerate liquids or food, changes in voice cough, altered mental status or any concerns. No signs of systemic illness or infection. The patient is nontoxic-appearing on exam and vital signs are within normal limits.    I have reviewed the triage vital signs and the nursing notes. Pertinent labs &imaging results that were available during my care of the patient were reviewed by me and considered in my medical decision making (see chart for details).  After history, exam, and medical workup I feel the patient has been appropriately medically screened and is safe for discharge home. Pertinent diagnoses were discussed with the patient. Patient was given return precautions     Tiwan Schnitker, MD 10/05/17 [phone]

## 2017-10-05 NOTE — ED Notes (Signed)
ED Provider at bedside. 

## 2017-10-08 NOTE — Telephone Encounter (Signed)
Left message on voicemail to call office.  

## 2017-12-09 ENCOUNTER — Ambulatory Visit: Payer: Commercial Managed Care - HMO | Admitting: Family Medicine

## 2017-12-09 ENCOUNTER — Encounter: Payer: Self-pay | Admitting: Family Medicine

## 2017-12-09 VITALS — BP 118/74 | HR 84 | Temp 97.9°F | Ht 64.0 in | Wt 222.0 lb

## 2017-12-09 DIAGNOSIS — R05 Cough: Secondary | ICD-10-CM

## 2017-12-09 DIAGNOSIS — R350 Frequency of micturition: Secondary | ICD-10-CM | POA: Diagnosis not present

## 2017-12-09 DIAGNOSIS — R059 Cough, unspecified: Secondary | ICD-10-CM

## 2017-12-09 LAB — POCT URINALYSIS DIPSTICK
Bilirubin, UA: NEGATIVE
Glucose, UA: NEGATIVE
KETONES UA: NEGATIVE
NITRITE UA: NEGATIVE
PH UA: 6 (ref 5.0–8.0)
Spec Grav, UA: 1.025 (ref 1.010–1.025)
UROBILINOGEN UA: 2 U/dL — AB

## 2017-12-09 MED ORDER — IPRATROPIUM BROMIDE 0.06 % NA SOLN: 2.0000 | Freq: Four times a day (QID) | NASAL | 12 refills | Status: DC

## 2017-12-09 MED ORDER — CEPHALEXIN 500 MG PO CAPS: 500.0000 mg | ORAL_CAPSULE | Freq: Two times a day (BID) | ORAL | 0 refills | Status: AC

## 2017-12-09 MED ORDER — BENZONATATE 200 MG PO CAPS: 200.0000 mg | ORAL_CAPSULE | Freq: Two times a day (BID) | ORAL | 0 refills | Status: DC | PRN

## 2017-12-09 NOTE — Patient Instructions (Signed)
Start the keflex.  Start the atrovent.  Start tessalon for your cough.  Please stay well hydrated.  You can take tylenol and/or motrin as needed for low grade fever and pain.  Please let me know if your symptoms worsen or fail to improve.  Take care, Dr Jimmey RalphParker

## 2017-12-09 NOTE — Progress Notes (Signed)
    Subjective:  Kelsey Curtis is a 27 y.o. female who presents today for same-day appointment with a chief complaint of dysuria.   HPI:  Dysuria, Acute issue Started 5 days ago. Stable over that time. Associated with frequency and urgency. No treatments tried.  She has had occasional fevers and chills-see below problem.  No flank pain.  No hematuria.  No obvious alleviating or aggravating factors.  Cough, Acute Issue Also started 5 days ago. Improving. Associated with fever, body aches, rhinorrhea, and sore throat. Niece had similar symptoms.  Has not tried Tylenol or motrin.  Tried taking TheraFlu which helped.  No other obvious alleviating or aggravating factors. ROS: Per HPI  PMH: She reports that  has never smoked. she has never used smokeless tobacco. She reports that she drinks alcohol. She reports that she does not use drugs.  Objective:  Physical Exam: BP 118/74 (BP Location: Left Arm, Patient Position: Sitting, Cuff Size: Normal)   Pulse 84   Temp 97.9 F (36.6 C) (Oral)   Ht 5\' 4"  (1.626 m)   Wt 222 lb (100.7 kg)   SpO2 100%   BMI 38.11 kg/m   Gen: NAD, resting comfortably CV: RRR with no murmurs appreciated Pulm: NWOB, CTAB with no crackles, wheezes, or rhonchi GI: Normal bowel sounds present. Soft, Nontender, Nondistended. MSK: No edema, cyanosis, or clubbing noted.  No CVA tenderness.  Results for orders placed or performed in visit on 12/09/17 (from the past 24 hour(s))  POCT urinalysis dipstick     Status: Abnormal   Collection Time: 12/09/17 11:29 AM  Result Value Ref Range   Color, UA yellow    Clarity, UA cloudy    Glucose, UA Negative    Bilirubin, UA Negative    Ketones, UA Negative    Spec Grav, UA 1.025 1.010 - 1.025   Blood, UA 1+(Small)    pH, UA 6.0 5.0 - 8.0   Protein, UA Trace    Urobilinogen, UA 2.0 (A) 0.2 or 1.0 E.U./dL   Nitrite, UA Negative    Leukocytes, UA Moderate (2+) (A) Negative   Appearance     Odor     Assessment/Plan:   Dysuria Symptoms and UA consistent with UTI.  Will start Keflex 500 mg twice daily for 7-day course.  Will send for urine culture.  Encouraged good oral hydration.  Also recommend Tylenol as Motrin as needed.  Return precautions reviewed.  Follow-up as needed.  Cough Likely secondary to viral URI. No signs of bacterial infection. Start atrovent for rhinorrhea/sinus congestion. Start tessalon for cough. Recommended tylenol and/or motrin as needed for low grade fever and pain. Encouraged good oral hydration. Return precautions reviewed. Follow up as needed.   Katina Degreealeb M. Jimmey RalphParker, MD 12/09/2017 11:32 AM

## 2017-12-10 LAB — URINE CULTURE
MICRO NUMBER: 90086040
SPECIMEN QUALITY: ADEQUATE

## 2017-12-11 NOTE — Progress Notes (Signed)
Dr Lavone NeriParker's interpretation of your lab work:  Your urine culture confirms a urinary tract infection. The antibiotic you are on should treat this. If your symptoms are not improving or do not resolve after you finish your antibiotics, please let us know.   If you have any additional questions, please give us a call or send us a message through [facility].  Take care, Dr Jimmey RalphParker

## 2017-12-12 ENCOUNTER — Telehealth: Payer: Self-pay | Admitting: Physician Assistant

## 2017-12-12 ENCOUNTER — Other Ambulatory Visit: Payer: Self-pay

## 2017-12-12 MED ORDER — AMOXICILLIN 500 MG PO CAPS: 500.0000 mg | ORAL_CAPSULE | Freq: Three times a day (TID) | ORAL | 0 refills | Status: AC

## 2017-12-12 NOTE — Telephone Encounter (Signed)
See note

## 2017-12-12 NOTE — Telephone Encounter (Signed)
We can switch her antibiotic to amoxicillin 500mg  tid for 7 days.

## 2017-12-12 NOTE — Telephone Encounter (Signed)
Rx sent to pharmacy and patient notified and verbalized understanding.

## 2017-12-12 NOTE — Telephone Encounter (Signed)
Please advise 

## 2017-12-12 NOTE — Telephone Encounter (Signed)
Copied from CRM [id number]. Topic: Inquiry >> Dec 12, 2017  1:33 PM Crist InfanteHarrald, Kathy J wrote: Reason for CRM: pt was to call back if symptoms of urinary tract were not better. Pt states shie is 4 days in a 7 day, but pt states she is not ANY better. Still having frequency, spasms, and wants to know what can be done  [id number] [place], [place] - [address] [phone] (Phone) [phone] (Fax)

## 2018-05-02 ENCOUNTER — Encounter: Payer: Self-pay | Admitting: Physician Assistant

## 2018-05-02 ENCOUNTER — Ambulatory Visit (INDEPENDENT_AMBULATORY_CARE_PROVIDER_SITE_OTHER): Payer: 59 | Admitting: Physician Assistant

## 2018-05-02 VITALS — BP 112/82 | HR 75 | Temp 98.3°F | Ht 64.0 in | Wt 212.2 lb

## 2018-05-02 DIAGNOSIS — H1031 Unspecified acute conjunctivitis, right eye: Secondary | ICD-10-CM | POA: Diagnosis not present

## 2018-05-02 MED ORDER — POLYMYXIN B-TRIMETHOPRIM 10000-0.1 UNIT/ML-% OP SOLN: 1.0000 [drp] | OPHTHALMIC | 0 refills | Status: DC

## 2018-05-02 NOTE — Patient Instructions (Signed)

## 2018-05-02 NOTE — Progress Notes (Signed)
Kelsey LabrumGabrielle Curtis is a 27 y.o. female here for a new problem.  History of Present Illness:   Chief Complaint  Patient presents with  . Conjunctivitis    watery, painful and irritating started 2 days ago    HPI   Patient reports 2 days of left eye pain, redness, watery discharge.  She has had some intermittent blurriness with his eyes well.  Currently does not wear contacts or glasses.  She denies any known contacts with pinkeye.  She has had pinkeye in the past.  She has been trying to use over-the-counter Visine drops which have not provided any relief.    Past Medical History:  Diagnosis Date  . Pyelonephritis      Social History   Socioeconomic History  . Marital status: Single    Spouse name: Not on file  . Number of children: Not on file  . Years of education: Not on file  . Highest education level: Not on file  Occupational History  . Not on file  Social Needs  . Financial resource strain: Not on file  . Food insecurity:    Worry: Not on file    Inability: Not on file  . Transportation needs:    Medical: Not on file    Non-medical: Not on file  Tobacco Use  . Smoking status: Never Smoker  . Smokeless tobacco: Never Used  Substance and Sexual Activity  . Alcohol use: Yes    Comment: Occ.  . Drug use: No  . Sexual activity: Yes    Birth control/protection: Pill  Lifestyle  . Physical activity:    Days per week: Not on file    Minutes per session: Not on file  . Stress: Not on file  Relationships  . Social connections:    Talks on phone: Not on file    Gets together: Not on file    Attends religious service: Not on file    Active member of club or organization: Not on file    Attends meetings of clubs or organizations: Not on file    Relationship status: Not on file  . Intimate partner violence:    Fear of current or ex partner: Not on file    Emotionally abused: Not on file    Physically abused: Not on file    Forced sexual activity: Not on file   Other Topics Concern  . 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  Family History  Problem Relation Age of Onset  . Alzheimer's disease Maternal Grandmother   . Colon cancer Neg Hx   . Breast cancer Neg Hx     No Known Allergies  Current Medications:   Current Outpatient Medications:  .  ipratropium (ATROVENT) 0.06 % nasal spray, Place 2 sprays into both nostrils 4 (four) times daily., Disp: 15 mL, Rfl: 12 .  JUNEL FE 1/20 1-20 MG-MCG tablet, , Disp: , Rfl:  .  benzonatate (TESSALON) 200 MG capsule, Take 1 capsule (200 mg total) by mouth 2 (two) times daily as needed for cough. (Patient not taking: Reported on 05/02/2018), Disp: 20 capsule, Rfl: 0 .  trimethoprim-polymyxin b (POLYTRIM) ophthalmic solution, Place 1 drop into the left eye every 4 (four) hours., Disp: 10 mL, Rfl:  0   Review of Systems:   ROS Negative unless otherwise specified per HPI.   Vitals:   Vitals:   05/02/18 1438  BP: 112/82  Pulse: 75  Temp: 98.3 F (36.8 C)  TempSrc: Oral  SpO2: 98%  Weight: 212 lb 3.2 oz (96.3 kg)  Height: 5\' 4"  (1.626 m)     Body mass index is 36.42 kg/m.  Physical Exam:   Physical Exam  Constitutional: She appears well-developed. She is cooperative.  Non-toxic appearance. She does not have a sickly appearance. She does not appear ill. No distress.  HENT:  Head: Normocephalic and atraumatic.  Right Ear: Tympanic membrane, external ear and ear canal normal. Tympanic membrane is not erythematous, not retracted and not bulging.  Left Ear: Tympanic membrane, external ear and ear canal normal. Tympanic membrane is not erythematous, not retracted and not bulging.  Nose: Nose normal. Right sinus exhibits no maxillary sinus tenderness and no frontal sinus tenderness. Left  sinus exhibits no maxillary sinus tenderness and no frontal sinus tenderness.  Mouth/Throat: Uvula is midline. No posterior oropharyngeal edema or posterior oropharyngeal erythema.  Eyes: Pupils are equal, round, and reactive to light. EOM and lids are normal. Left eye exhibits discharge. Left conjunctiva is injected.  Neck: Trachea normal.  Cardiovascular: Normal rate, regular rhythm, S1 normal, S2 normal and normal heart sounds.  Pulmonary/Chest: Effort normal and breath sounds normal. She has no decreased breath sounds. She has no wheezes. She has no rhonchi. She has no rales.  Lymphadenopathy:    She has no cervical adenopathy.  Neurological: She is alert.  Skin: Skin is warm, dry and intact.  Psychiatric: She has a normal mood and affect. Her speech is normal and behavior is normal.  Nursing note and vitals reviewed.   Assessment and Plan:    [name] was seen today for conjunctivitis.  Diagnoses and all orders for this visit:  Acute conjunctivitis of right eye, unspecified acute conjunctivitis type  Other orders -     trimethoprim-polymyxin b (POLYTRIM) ophthalmic solution; Place 1 drop into the left eye every 4 (four) hours.   No red flags. Will treat with polytrim. Follow-up if symptoms worsen or persist despite treatment. Work note provided.  . Reviewed expectations re: course of current medical issues. . Discussed self-management of symptoms. . Outlined signs and symptoms indicating need for more acute intervention. . Patient verbalized understanding and all questions were answered. . See orders for this visit as documented in the electronic medical record. . Patient received an After-Visit Summary.  Jarold Motto, PA-C

## 2018-05-05 ENCOUNTER — Telehealth: Payer: Self-pay | Admitting: Physician Assistant

## 2018-05-05 NOTE — Telephone Encounter (Signed)
See note.   Copied from CRM [phone]. Topic: General - Other >> May 05, 2018  9:13 AM [name], Jennifer S wrote: Reason for CRM: Need work note for pink eye for 05/04/16. Please advise

## 2018-05-05 NOTE — Telephone Encounter (Signed)
Completed letter for work. Pt can return to work 6.17.19. Left note up front with ladies at desk for pt to pick up. TLG

## 2018-06-03 ENCOUNTER — Encounter: Payer: Self-pay | Admitting: Physician Assistant

## 2018-06-03 ENCOUNTER — Ambulatory Visit (INDEPENDENT_AMBULATORY_CARE_PROVIDER_SITE_OTHER): Payer: 59 | Admitting: Physician Assistant

## 2018-06-03 VITALS — BP 120/72 | HR 81 | Temp 98.6°F | Ht 64.0 in | Wt 215.2 lb

## 2018-06-03 DIAGNOSIS — S90861A Insect bite (nonvenomous), right foot, initial encounter: Secondary | ICD-10-CM

## 2018-06-03 DIAGNOSIS — A6004 Herpesviral vulvovaginitis: Secondary | ICD-10-CM | POA: Diagnosis not present

## 2018-06-03 DIAGNOSIS — W57XXXA Bitten or stung by nonvenomous insect and other nonvenomous arthropods, initial encounter: Secondary | ICD-10-CM

## 2018-06-03 MED ORDER — ACYCLOVIR 400 MG PO TABS: 400.0000 mg | ORAL_TABLET | Freq: Three times a day (TID) | ORAL | 2 refills | Status: AC

## 2018-06-03 NOTE — Patient Instructions (Addendum)
It was great to see you!  For the insect bite: Cold compresses can help with itching Topical anti-itch cream can be purchased over the counter, choose with a corticosteroid in it Take daily antihistamine for a few days (benadryl or zyrtec or allegra, for example)  For the genital lesion: Start acyclovir, I have sent in a refill if you have another outbreak. If symptoms do not improve, come back and see us.

## 2018-06-03 NOTE — Progress Notes (Signed)
Kelsey Curtis is a 27 y.o. female here for a recurrence of a previously resolved problem.  I acted as a Neurosurgeonscribe for [employer]Samantha Urho Rio, PA-C Corky Mullonna Orphanos, LPN  History of Present Illness:   Chief Complaint  Patient presents with  . Herpes outbreak  . Insect Bite    Female GU Problem  The patient's primary symptoms include genital lesions. Primary symptoms comment: Herpes outbreak. This is a recurrent (Pt has not had an outbreak in two years.) problem. The current episode started yesterday. The problem has been unchanged. The pain is mild. The problem affects the right side. She is not pregnant. Pertinent negatives include no chills, constipation, diarrhea, discolored urine, fever, frequency, hematuria, painful intercourse or urgency. The symptoms are aggravated by activity. She has tried warm baths for the symptoms. The treatment provided mild relief. She is sexually active. No, her partner does not have an STD. She uses oral contraceptives for contraception. Her menstrual history has been regular. Her past medical history is significant for herpes simplex.   Insect bite Pt has 5 bites on Right foot happened on Saturday. One area red and itchy other area on foot looks bruised. Pt did put alcohol on areas and triple antibiotic ointment. No relief. She also tried Benadryl without relief.   Past Medical History:  Diagnosis Date  . Pyelonephritis      Social History   Socioeconomic History  . Marital status: Single    Spouse name: Not on file  . Number of children: Not on file  . Years of education: Not on file  . Highest education level: Not on file  Occupational History  . Not on file  Social Needs  . Financial resource strain: Not on file  . Food insecurity:    Worry: Not on file    Inability: Not on file  . Transportation needs:    Medical: Not on file    Non-medical: Not on file  Tobacco Use  . Smoking status: Never Smoker  . Smokeless tobacco: Never Used  Substance  and Sexual Activity  . Alcohol use: Yes    Comment: Occ.  . Drug use: No  . Sexual activity: Yes    Birth control/protection: Pill  Lifestyle  . Physical activity:    Days per week: Not on file    Minutes per session: Not on file  . Stress: Not on file  Relationships  . Social connections:    Talks on phone: Not on file    Gets together: Not on file    Attends religious service: Not on file    Active member of club or organization: Not on file    Attends meetings of clubs or organizations: Not on file    Relationship status: Not on file  . Intimate partner violence:    Fear of current or ex partner: Not on file    Emotionally abused: Not on file    Physically abused: Not on file    Forced sexual activity: Not on file  Other Topics Concern  . 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 No pertinent surgical history.  Family History  Problem Relation Age of Onset  . Alzheimer's disease Maternal Grandmother   . Colon cancer Neg Hx   . Breast cancer  Neg Hx     No Known Allergies  Current Medications:   Current Outpatient Medications:  .  JUNEL FE 1/20 1-20 MG-MCG tablet, , Disp: , Rfl:  .  acyclovir (ZOVIRAX) 400 MG tablet, Take 1 tablet (400 mg total) by mouth 3 (three) times daily for 5 days., Disp: 15 tablet, Rfl: 2   Review of Systems:   Review of Systems  Constitutional: Negative for chills and fever.  Gastrointestinal: Negative for constipation and diarrhea.  Genitourinary: Negative for frequency, hematuria and urgency.    Vitals:   Vitals:   06/03/18 1306  BP: 120/72  Pulse: 81  Temp: 98.6 F (37 C)  TempSrc: Oral  SpO2: 97%  Weight: 215 lb 4 oz (97.6 kg)  Height: 5\' 4"  (1.626 m)     Body mass index is 36.95 kg/m.  Physical Exam:   Physical Exam   Constitutional: She appears well-developed. She is cooperative.  Non-toxic appearance. She does not have a sickly appearance. She does not appear ill. No distress.  Cardiovascular: Normal rate, regular rhythm, S1 normal, S2 normal, normal heart sounds and normal pulses.  No LE edema  Pulmonary/Chest: Effort normal and breath sounds normal.  Genitourinary:     Neurological: She is alert. GCS eye subscore is 4. GCS verbal subscore is 5. GCS motor subscore is 6.  Skin: Skin is warm, dry and intact.  Multiple erythematous papules to dorsum of lateral R foot.  Psychiatric: She has a normal mood and affect. Her speech is normal and behavior is normal.  Nursing note and vitals reviewed.   Assessment and Plan:    Kelsey Curtis was seen today for herpes outbreak and insect bite.  Diagnoses and all orders for this visit:  Insect bite of right foot, initial encounter I recommended cold compresses, topical anti-itch cream with corticosteroid, and daily antihistamine.  Follow-up if symptoms worsen or persist despite treatment. No red flags on exam.  Herpes simplex vulvovaginitis Initiate acyclovir 500 mg tablet 3 times daily x5 days.  I did give her a refill for 2 more rounds of this should she need it.  Help symptoms do not improve with treatment.  No red flags on exam.  Other orders -     acyclovir (ZOVIRAX) 400 MG tablet; Take 1 tablet (400 mg total) by mouth 3 (three) times daily for 5 days.   . Reviewed expectations re: course of current medical issues. . Discussed self-management of symptoms. . Outlined signs and symptoms indicating need for more acute intervention. . Patient verbalized understanding and all questions were answered. . See orders for this visit as documented in the electronic medical record. . Patient received an After-Visit Summary.  CMA or LPN served as scribe during this visit. History, Physical, and Plan performed by medical provider. Documentation and orders  reviewed and attested to.   Jarold Motto, PA-C

## 2018-08-27 ENCOUNTER — Encounter: Payer: Self-pay | Admitting: Physician Assistant

## 2018-08-27 ENCOUNTER — Ambulatory Visit (INDEPENDENT_AMBULATORY_CARE_PROVIDER_SITE_OTHER): Payer: 59 | Admitting: Physician Assistant

## 2018-08-27 VITALS — BP 130/80 | HR 88 | Temp 98.2°F | Ht 64.0 in | Wt 217.5 lb

## 2018-08-27 DIAGNOSIS — R5383 Other fatigue: Secondary | ICD-10-CM | POA: Diagnosis not present

## 2018-08-27 DIAGNOSIS — F419 Anxiety disorder, unspecified: Secondary | ICD-10-CM

## 2018-08-27 LAB — COMPREHENSIVE METABOLIC PANEL
ALT: 19 U/L (ref 0–35)
AST: 16 U/L (ref 0–37)
Albumin: 4.2 g/dL (ref 3.5–5.2)
Alkaline Phosphatase: 76 U/L (ref 39–117)
BILIRUBIN TOTAL: 0.5 mg/dL (ref 0.2–1.2)
BUN: 11 mg/dL (ref 6–23)
CALCIUM: 9.4 mg/dL (ref 8.4–10.5)
CO2: 31 meq/L (ref 19–32)
Chloride: 102 mEq/L (ref 96–112)
Creatinine, Ser: 0.85 mg/dL (ref 0.40–1.20)
GFR: 102.19 mL/min (ref >60.00)
Glucose, Bld: 85 mg/dL (ref 70–99)
Potassium: 3.6 mEq/L (ref 3.5–5.1)
Sodium: 139 mEq/L (ref 135–145)
Total Protein: 8 g/dL (ref 6.0–8.3)

## 2018-08-27 LAB — POCT URINE PREGNANCY: PREG TEST UR: NEGATIVE

## 2018-08-27 LAB — CBC
HCT: 43.5 % (ref 36.0–46.0)
Hemoglobin: 14.5 g/dL (ref 12.0–15.0)
MCHC: 33.3 g/dL (ref 30.0–36.0)
MCV: 84.6 fl (ref 78.0–100.0)
PLATELETS: 296 10*3/uL (ref 150.0–400.0)
RBC: 5.15 Mil/uL — ABNORMAL HIGH (ref 3.87–5.11)
RDW: 12.8 % (ref 11.5–15.5)
WBC: 8.2 10*3/uL (ref 4.0–10.5)

## 2018-08-27 LAB — TSH: TSH: 1.16 u[IU]/mL (ref 0.35–4.50)

## 2018-08-27 MED ORDER — ESCITALOPRAM OXALATE 10 MG PO TABS: 10.0000 mg | ORAL_TABLET | Freq: Every day | ORAL | 1 refills | Status: DC

## 2018-08-27 MED ORDER — BUSPIRONE HCL 5 MG PO TABS: 5.0000 mg | ORAL_TABLET | Freq: Three times a day (TID) | ORAL | 1 refills | Status: DC | PRN

## 2018-08-27 NOTE — Progress Notes (Signed)
Kelsey Curtis is a 27 y.o. female here for a new problem.  I acted as a Neurosurgeon for [employer], PA-C Corky Mull, LPN  History of Present Illness:   Chief Complaint  Patient presents with  . Anxiety    Anxiety  Presents for initial visit. Episode onset: Pt has been having anxiety issues past two months. The problem has been gradually worsening. Symptoms include decreased concentration, depressed mood, dizziness, excessive worry, insomnia, irritability, malaise, muscle tension (shoulders), nausea, nervous/anxious behavior, restlessness and shortness of breath. Patient reports no confusion, palpitations, panic or suicidal ideas. Primary symptoms comment: Headaches 3-4 times a week. Symptoms occur constantly. The most recent episode lasted 2 hours (Having episodes at work, works nightshift). The severity of symptoms is moderate, causing significant distress and interfering with daily activities. The symptoms are aggravated by work stress, family issues and social activities. The patient sleeps 4 hours per night. The quality of sleep is poor. Nighttime awakenings: one to two.   Risk factors: work. Past treatments include nothing (Pt is taking Aleve for headaches).   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 She is having difficulty finding ways to relieve stress because her job is consuming her. Denies SI/HI.  Has been using a virtual therapist.  Patient's last menstrual period was 08/12/2018.  GAD 7 : Generalized Anxiety Score 08/27/2018  Nervous, Anxious, on Edge 3  Control/stop worrying 3  Worry too much - different things 3  Trouble relaxing 2  Restless 3  Easily annoyed or irritable 3  Afraid - awful might happen 2  Total GAD 7 Score 19  Anxiety Difficulty Extremely difficult      Past Medical History:  Diagnosis Date  . Pyelonephritis      Social History   Socioeconomic History  . Marital status: Single    Spouse name: Not on file  . Number of  children: Not on file  . Years of education: Not on file  . Highest education level: Not on file  Occupational History  . Not on file  Social Needs  . Financial resource strain: Not on file  . Food insecurity:    Worry: Not on file    Inability: Not on file  . Transportation needs:    Medical: Not on file    Non-medical: Not on file  Tobacco Use  . Smoking status: Never Smoker  . Smokeless tobacco: Never Used  Substance and Sexual Activity  . Alcohol use: Yes    Comment: Occ.  . Drug use: No  . Sexual activity: Yes    Birth control/protection: Pill  Lifestyle  . Physical activity:    Days per week: Not on file    Minutes per session: Not on file  . Stress: Not on file  Relationships  . Social connections:    Talks on phone: Not on file    Gets together: Not on file    Attends religious service: Not on file    Active member of club or organization: Not on file    Attends meetings of clubs or organizations: Not on file    Relationship status: Not on file  . Intimate partner violence:    Fear of current or ex partner: Not on file    Emotionally abused: Not on file    Physically abused: Not on file    Forced sexual activity: Not on file  Other Topics Concern  . 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 No pertinent surgical history.  Family History  Problem Relation Age of Onset  . Alzheimer's disease Maternal Grandmother   . Colon cancer Neg Hx   . Breast cancer Neg Hx     No Known Allergies  Current Medications:   Current Outpatient Medications:  .  JUNEL FE 1/20 1-20 MG-MCG tablet, , Disp: , Rfl:  .  naproxen sodium (ALEVE) 220 MG tablet, Take 440 mg by mouth as needed., Disp: , Rfl:  .  busPIRone (BUSPAR) 5 MG tablet, Take 1 tablet (5 mg total) by mouth 3 (three)  times daily as needed., Disp: 60 tablet, Rfl: 1 .  escitalopram (LEXAPRO) 10 MG tablet, Take 1 tablet (10 mg total) by mouth daily., Disp: 30 tablet, Rfl: 1   Review of Systems:   Review of Systems  Constitutional: Positive for irritability.  Respiratory: Positive for shortness of breath.   Cardiovascular: Negative for palpitations.  Gastrointestinal: Positive for nausea.  Neurological: Positive for dizziness.  Psychiatric/Behavioral: Positive for decreased concentration. Negative for confusion and suicidal ideas. The patient is nervous/anxious and has insomnia.     Vitals:   Vitals:   08/27/18 1306  BP: 130/80  Pulse: 88  Temp: 98.2 F (36.8 C)  TempSrc: Oral  Weight: 217 lb 8 oz (98.7 kg)  Height: 5\' 4"  (1.626 m)     Body mass index is 37.33 kg/m.  Physical Exam:   Physical Exam  Constitutional: She appears well-developed. She is cooperative.  Non-toxic appearance. She does not have a sickly appearance. She does not appear ill. No distress.  Cardiovascular: Normal rate, regular rhythm, S1 normal, S2 normal, normal heart sounds and normal pulses.  No LE edema  Pulmonary/Chest: Effort normal and breath sounds normal.  Neurological: She is alert. GCS eye subscore is 4. GCS verbal subscore is 5. GCS motor subscore is 6.  Skin: Skin is warm, dry and intact.  Psychiatric: She has a normal mood and affect. Her speech is normal and behavior is normal.  Nursing note and vitals reviewed.   Assessment and Plan:    Berdina was seen today for anxiety.  Diagnoses and all orders for this visit:  Anxiety and Fatigue, unspecified type Urine preg negative. Labs pending. No current SI/HI. Discussed starting Lexapro 10 mg daily and Buspar 5 mg TID prn for breakthrough anxiety. She is interested in in-person counseling, recommended Colen Darling in our office. I will reach out to Colen Darling to let her know that I have recommended patient for her. Follow-up in 4-6 weeks for medication  f/u, sooner if needed. Briefly discussed possible FMLA. -     CBC -     Comprehensive metabolic panel -     TSH -     POCT urine pregnancy  Other orders -     escitalopram (LEXAPRO) 10 MG tablet; Take 1 tablet (10 mg total) by mouth daily. -     busPIRone (BUSPAR) 5 MG tablet; Take 1 tablet (5 mg total) by mouth 3 (three) times daily as needed.  . Reviewed expectations re: course of current medical issues. . Discussed self-management of symptoms. . Outlined signs and symptoms indicating need for more acute intervention. . Patient verbalized understanding and all questions were answered. . See orders for this visit as documented in the electronic  medical record. . Patient received an After-Visit Summary.  CMA or LPN served as scribe during this visit. History, Physical, and Plan performed by medical provider. The above documentation has been reviewed and is accurate and complete.  Jarold Motto, PA-C

## 2018-08-27 NOTE — Patient Instructions (Signed)
It was great to see you!  Start Lexapro 10 mg daily. May take 5 mg Buspar as needed up to three times daily for breakthrough anxiety. Please contact Misty Stanley for therapy, I will tell her to expect your call.  Let's follow-up in 4-6 weeks, sooner if you have concerns.  Take care,  Jarold Motto PA-C

## 2018-09-02 DIAGNOSIS — Z0279 Encounter for issue of other medical certificate: Secondary | ICD-10-CM

## 2018-09-03 ENCOUNTER — Telehealth: Payer: Self-pay | Admitting: *Deleted

## 2018-09-03 NOTE — Telephone Encounter (Signed)
Pt came by and filled out rest of FMLA paperwork. FMLA paperwork faxed to [phone]. Original mailed to pt and copy put in chart.

## 2018-09-03 NOTE — Telephone Encounter (Signed)
Left message on voicemail to call office.  

## 2018-09-03 NOTE — Telephone Encounter (Signed)
Pt called back, told by Maddy at the front desk FMLA paperwork is ready except you did not fill out one part. Also the cost is $29.00. Pt verbalized understanding and said she will come by today or tomorrow and finish her part so we can fax it.

## 2018-09-08 ENCOUNTER — Encounter: Payer: Self-pay | Admitting: Physician Assistant

## 2018-10-06 ENCOUNTER — Ambulatory Visit: Payer: 59 | Admitting: Physician Assistant

## 2018-10-22 ENCOUNTER — Ambulatory Visit: Payer: 59 | Admitting: Psychology

## 2018-10-30 ENCOUNTER — Ambulatory Visit (INDEPENDENT_AMBULATORY_CARE_PROVIDER_SITE_OTHER): Payer: PRIVATE HEALTH INSURANCE | Admitting: Psychology

## 2018-10-30 DIAGNOSIS — F411 Generalized anxiety disorder: Secondary | ICD-10-CM

## 2018-11-05 ENCOUNTER — Ambulatory Visit (INDEPENDENT_AMBULATORY_CARE_PROVIDER_SITE_OTHER): Payer: PRIVATE HEALTH INSURANCE | Admitting: Psychology

## 2018-11-05 DIAGNOSIS — F411 Generalized anxiety disorder: Secondary | ICD-10-CM

## 2018-11-20 ENCOUNTER — Ambulatory Visit: Payer: Self-pay | Admitting: Psychology

## 2018-12-16 ENCOUNTER — Ambulatory Visit: Payer: 59 | Admitting: Psychology

## 2018-12-30 ENCOUNTER — Ambulatory Visit: Payer: Self-pay | Admitting: Psychology

## 2019-01-02 ENCOUNTER — Encounter: Payer: Self-pay | Admitting: Physician Assistant

## 2019-01-02 ENCOUNTER — Ambulatory Visit (INDEPENDENT_AMBULATORY_CARE_PROVIDER_SITE_OTHER): Payer: 59 | Admitting: Physician Assistant

## 2019-01-02 VITALS — BP 102/70 | HR 80 | Temp 98.4°F | Ht 64.0 in | Wt 225.0 lb

## 2019-01-02 DIAGNOSIS — A6004 Herpesviral vulvovaginitis: Secondary | ICD-10-CM | POA: Diagnosis not present

## 2019-01-02 MED ORDER — VALACYCLOVIR HCL 1 G PO TABS: ORAL_TABLET | ORAL | 2 refills | Status: DC

## 2019-01-02 NOTE — Progress Notes (Signed)
Kelsey Curtis is a 28 y.o. female here for a new problem.  History of Present Illness:   Chief Complaint  Patient presents with  . HSV break out    rt side of vagina, starting on Wednesday, 12/31/18   HPI  History of herpes outbreak. Has had some issues with flu recently and states that when she gets sick she often has a herpes outbreak. Gets them about 1 x a year. Presently doesn't have any medication. She is sexually active. Patient's last menstrual period was 12/23/2018.   She denies: dysuria, vaginal discharge, vaginal/anal bleeding, lower back pain, n/v/d  Past Medical History:  Diagnosis Date  . Pyelonephritis      Social History   Socioeconomic History  . Marital status: Single    Spouse name: Not on file  . Number of children: Not on file  . Years of education: Not on file  . Highest education level: Not on file  Occupational History  . Not on file  Social Needs  . Financial resource strain: Not on file  . Food insecurity:    Worry: Not on file    Inability: Not on file  . Transportation needs:    Medical: Not on file    Non-medical: Not on file  Tobacco Use  . Smoking status: Never Smoker  . Smokeless tobacco: Never Used  Substance and Sexual Activity  . Alcohol use: Yes    Comment: Occ.  . Drug use: No  . Sexual activity: Yes    Birth control/protection: Pill  Lifestyle  . Physical activity:    Days per week: Not on file    Minutes per session: Not on file  . Stress: Not on file  Relationships  . Social connections:    Talks on phone: Not on file    Gets together: Not on file    Attends religious service: Not on file    Active member of club or organization: Not on file    Attends meetings of clubs or organizations: Not on file    Relationship status: Not on file  . Intimate partner violence:    Fear of current or ex partner: Not on file    Emotionally abused: Not on file    Physically abused: Not on file    Forced sexual activity: Not  on file  Other Topics Concern  . 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 No pertinent surgical history.  Family History  Problem Relation Age of Onset  . Alzheimer's disease Maternal Grandmother   . Colon cancer Neg Hx   . Breast cancer Neg Hx     No Known Allergies  Current Medications:   Current Outpatient Medications:  .  busPIRone (BUSPAR) 5 MG tablet, Take 1 tablet (5 mg total) by mouth 3 (three) times daily as needed., Disp: 60 tablet, Rfl: 1 .  JUNEL FE 1/20 1-20 MG-MCG tablet, , Disp: , Rfl:  .  naproxen sodium (ALEVE) 220 MG tablet, Take 440 mg by mouth as needed., Disp: , Rfl:  .  escitalopram (LEXAPRO) 10 MG tablet, Take 1 tablet (10 mg total) by mouth daily. (Patient not taking: Reported on 01/02/2019), Disp: 30 tablet, Rfl: 1 .  valACYclovir (VALTREX) 1000 MG tablet, Take two tablets ( total  2000 mg) by mouth q12h x 1 day; Start: ASAP after symptom onset, Disp: 6 tablet, Rfl: 2   Review of Systems:   Review of Systems  Constitutional: Negative for chills, fever, malaise/fatigue and weight loss.  Respiratory: Negative for shortness of breath.   Cardiovascular: Negative for chest pain, orthopnea, claudication and leg swelling.  Gastrointestinal: Negative for heartburn, nausea and vomiting.  Genitourinary: Negative for dysuria, frequency and urgency.  Neurological: Negative for dizziness, tingling and headaches.     Vitals:   Vitals:   01/02/19 0907  BP: 102/70  Pulse: 80  Temp: 98.4 F (36.9 C)  TempSrc: Oral  SpO2: 97%  Weight: 225 lb (102.1 kg)  Height: 5\' 4"  (1.626 m)     Body mass index is 38.62 kg/m.  Physical Exam:   Physical Exam Vitals signs and nursing note reviewed. Exam conducted with a chaperone present.  Constitutional:      General: She is  not in acute distress.    Appearance: She is well-developed. She is not ill-appearing or toxic-appearing.  Cardiovascular:     Rate and Rhythm: Normal rate and regular rhythm.     Pulses: Normal pulses.     Heart sounds: Normal heart sounds, S1 normal and S2 normal.     Comments: No LE edema Pulmonary:     Effort: Pulmonary effort is normal.     Breath sounds: Normal breath sounds.  Genitourinary:    Labia:        Right: Tenderness and lesion present.        Left: No lesion.      Comments: Small collective area of several vesicles at approximately 7 o'clock of vaginal introitus on R outer labia. Tenderness with palpation. Skin:    General: Skin is warm and dry.  Neurological:     Mental Status: She is alert.     GCS: GCS eye subscore is 4. GCS verbal subscore is 5. GCS motor subscore is 6.  Psychiatric:        Speech: Speech normal.        Behavior: Behavior normal. Behavior is cooperative.       Assessment and Plan:   Kelsey Curtis was seen today for hsv break out.  Diagnoses and all orders for this visit:  Herpes simplex vulvovaginitis  Other orders -     valACYclovir (VALTREX) 1000 MG tablet; Take two tablets ( total 2000 mg) by mouth q12h x 1 day; Start: ASAP after symptom onset   No red flags on exam. Will treat with valtrex per orders. Follow-up if symptoms persist despite treatment. Declined STD testing today.  . Reviewed expectations re: course of current medical issues. . Discussed self-management of symptoms. . Outlined signs and symptoms indicating need for more acute intervention. . Patient verbalized understanding and all questions were answered. . See orders for this visit as documented in the electronic medical record. . Patient received an After-Visit Summary.  CMA or LPN served as scribe during this visit. History, Physical, and Plan performed by medical provider. The above documentation has been reviewed and is accurate and complete.   Jarold Motto, PA-C

## 2019-01-02 NOTE — Patient Instructions (Signed)
Start the medication as needed for your flare.  Keep [place] posted if your symptoms do not improve.

## 2019-02-02 ENCOUNTER — Ambulatory Visit (INDEPENDENT_AMBULATORY_CARE_PROVIDER_SITE_OTHER): Payer: 59 | Admitting: Physician Assistant

## 2019-02-02 ENCOUNTER — Other Ambulatory Visit: Payer: Self-pay

## 2019-02-02 ENCOUNTER — Ambulatory Visit (INDEPENDENT_AMBULATORY_CARE_PROVIDER_SITE_OTHER): Payer: 59

## 2019-02-02 ENCOUNTER — Encounter: Payer: Self-pay | Admitting: Physician Assistant

## 2019-02-02 VITALS — BP 116/72 | HR 76 | Temp 98.3°F | Ht 64.0 in | Wt 230.4 lb

## 2019-02-02 DIAGNOSIS — R350 Frequency of micturition: Secondary | ICD-10-CM

## 2019-02-02 DIAGNOSIS — R3915 Urgency of urination: Secondary | ICD-10-CM

## 2019-02-02 DIAGNOSIS — R1032 Left lower quadrant pain: Secondary | ICD-10-CM

## 2019-02-02 LAB — POCT URINALYSIS DIPSTICK
Bilirubin, UA: NEGATIVE
Glucose, UA: NEGATIVE
KETONES UA: NEGATIVE
Leukocytes, UA: NEGATIVE
NITRITE UA: NEGATIVE
PH UA: 6 (ref 5.0–8.0)
PROTEIN UA: NEGATIVE
RBC UA: NEGATIVE
Spec Grav, UA: 1.025 (ref 1.010–1.025)
UROBILINOGEN UA: 1 U/dL

## 2019-02-02 LAB — POCT URINE PREGNANCY: Preg Test, Ur: NEGATIVE

## 2019-02-02 MED ORDER — TAMSULOSIN HCL 0.4 MG PO CAPS: 0.4000 mg | ORAL_CAPSULE | Freq: Every day | ORAL | 0 refills | Status: DC

## 2019-02-02 NOTE — Progress Notes (Signed)
Kelsey Curtis is a 28 y.o. female here for a follow up of a pre-existing problem.  I acted as a Neurosurgeon for [employer], PA-C Corky Mull, LPN  History of Present Illness:   Chief Complaint  Patient presents with  . Left Flank pain    Flank Pain  This is a recurrent problem. Episode onset: Started last Wednesday, pt has Hx of Kidney  stone this past May. The problem occurs intermittently (was constant last Wed & Thurs). The problem is unchanged. The pain is present in the lumbar spine. The quality of the pain is described as aching and cramping. The pain does not radiate. The pain is at a severity of 6/10. The pain is moderate. The pain is worse during the night. The symptoms are aggravated by position. Associated symptoms include abdominal pain (Left lower side). Pertinent negatives include no dysuria, fever, headaches or pelvic pain. (Frequency and urgency with urination) She has tried NSAIDs for the symptoms. The treatment provided mild relief.   Patient's last menstrual period was 01/13/2019.  Took 2 Dulcolax on Thursday and only had two BMs.  Denies prior history of ovarian cysts.  Pain is slightly better today.    Past Medical History:  Diagnosis Date  . Pyelonephritis      Social History   Socioeconomic History  . Marital status: Single    Spouse name: Not on file  . Number of children: Not on file  . Years of education: Not on file  . Highest education level: Not on file  Occupational History  . Not on file  Social Needs  . Financial resource strain: Not on file  . Food insecurity:    Worry: Not on file    Inability: Not on file  . Transportation needs:    Medical: Not on file    Non-medical: Not on file  Tobacco Use  . Smoking status: Never Smoker  . Smokeless tobacco: Never Used  Substance and Sexual Activity  . Alcohol use: Yes    Comment: Occ.  . Drug use: No  . Sexual activity: Yes    Birth control/protection: Pill  Lifestyle  .  Physical activity:    Days per week: Not on file    Minutes per session: Not on file  . Stress: Not on file  Relationships  . Social connections:    Talks on phone: Not on file    Gets together: Not on file    Attends religious service: Not on file    Active member of club or organization: Not on file    Attends meetings of clubs or organizations: Not on file    Relationship status: Not on file  . Intimate partner violence:    Fear of current or ex partner: Not on file    Emotionally abused: Not on file    Physically abused: Not on file    Forced sexual activity: Not on file  Other Topics Concern  . 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 No pertinent surgical history.  Family History  Problem Relation Age of Onset  . Alzheimer's disease Maternal Grandmother   . Colon cancer Neg Hx   . Breast cancer Neg Hx     No Known Allergies  Current Medications:  Current Outpatient Medications:  .  busPIRone (BUSPAR) 5 MG tablet, Take 1 tablet (5 mg total) by mouth 3 (three) times daily as needed., Disp: 60 tablet, Rfl: 1 .  JUNEL FE 1/20 1-20 MG-MCG tablet, , Disp: , Rfl:  .  naproxen sodium (ALEVE) 220 MG tablet, Take 440 mg by mouth as needed., Disp: , Rfl:  .  valACYclovir (VALTREX) 1000 MG tablet, Take two tablets ( total 2000 mg) by mouth q12h x 1 day; Start: ASAP after symptom onset, Disp: 6 tablet, Rfl: 2 .  tamsulosin (FLOMAX) 0.4 MG CAPS capsule, Take 1 capsule (0.4 mg total) by mouth daily., Disp: 30 capsule, Rfl: 0   Review of Systems:   Review of Systems  Constitutional: Negative for fever.  Gastrointestinal: Positive for abdominal pain (Left lower side).  Genitourinary: Positive for flank pain. Negative for dysuria and pelvic pain.  Neurological: Negative for headaches.     Vitals:   Vitals:   02/02/19 0905  BP: 116/72  Pulse: 76  Temp: 98.3 F (36.8 C)  TempSrc: Oral  SpO2: 97%  Weight: 230 lb 6.1 oz (104.5 kg)  Height: 5\' 4"  (1.626 m)     Body mass index is 39.54 kg/m.  Physical Exam:   Physical Exam Vitals signs and nursing note reviewed.  Constitutional:      General: She is not in acute distress.    Appearance: She is well-developed. She is not ill-appearing or toxic-appearing.  Cardiovascular:     Rate and Rhythm: Normal rate and regular rhythm.     Pulses: Normal pulses.     Heart sounds: Normal heart sounds, S1 normal and S2 normal.     Comments: No LE edema Pulmonary:     Effort: Pulmonary effort is normal.     Breath sounds: Normal breath sounds.  Abdominal:     General: Abdomen is flat. Bowel sounds are normal.     Palpations: Abdomen is soft.     Tenderness: There is abdominal tenderness in the left lower quadrant. There is no right CVA tenderness, left CVA tenderness, guarding or rebound.  Skin:    General: Skin is warm and dry.  Neurological:     Mental Status: She is alert.     GCS: GCS eye subscore is 4. GCS verbal subscore is 5. GCS motor subscore is 6.  Psychiatric:        Speech: Speech normal.        Behavior: Behavior normal. Behavior is cooperative.    Results for orders placed or performed in visit on 02/02/19  POCT urinalysis dipstick  Result Value Ref Range   Color, UA Yellow    Clarity, UA Clear    Glucose, UA Negative Negative   Bilirubin, UA Negative    Ketones, UA Negative    Spec Grav, UA 1.025 1.010 - 1.025   Blood, UA Negative    pH, UA 6.0 5.0 - 8.0   Protein, UA Negative Negative   Urobilinogen, UA 1.0 0.2 or 1.0 E.U./dL   Nitrite, UA Negative    Leukocytes, UA Negative Negative   Appearance     Odor    POCT urine pregnancy  Result Value Ref Range   Preg Test, Ur Negative Negative     Assessment and Plan:   Kelsey Curtis was seen today for left flank pain.  Diagnoses and all  orders for this visit:  Frequency of urination; Urgency of urination; LLQ abdominal pain Unclear etiology at this point. Her symptoms are slowly improving.  UA negative. Urine preg negative. Abd xray with some degree of constipation, will start miralax regimen. I have also given her some flomax to use to see if this helps with her urination and flank pain. If no improvement in a few days, or worsening of symptoms, we will obtain further imaging. -     POCT urinalysis dipstick -     Urine Culture -     POCT urine pregnancy -     DG Abd 1 View; Future -     POCT urinalysis dipstick -     DG Abd 1 View; Future  Other orders -     tamsulosin (FLOMAX) 0.4 MG CAPS capsule; Take 1 capsule (0.4 mg total) by mouth daily.    . Reviewed expectations re: course of current medical issues. . Discussed self-management of symptoms. . Outlined signs and symptoms indicating need for more acute intervention. . Patient verbalized understanding and all questions were answered. . See orders for this visit as documented in the electronic medical record. . Patient received an After-Visit Summary.  CMA or LPN served as scribe during this visit. History, Physical, and Plan performed by medical provider. The above documentation has been reviewed and is accurate and complete.   Jarold Motto, PA-C

## 2019-02-02 NOTE — Patient Instructions (Signed)
It was great to see you!  Start a miralax regimen, start with 1 capful of miralax daily in liquid of choice. Do this daily to see if it helps with your bowels.  May use Flomax to see if this helps with urination.  If you don't feel improved in one to two days, let me know and we will get some imaging, sooner if worsening symptoms.  Take care,  Jarold Motto PA-C  I'm not saying you do have an ovarian cyst for certain, but here is more information:  Ovarian Cyst     An ovarian cyst is a fluid-filled sac that forms on an ovary. The ovaries are small organs that produce eggs in women. Various types of cysts can form on the ovaries. Some may cause symptoms and require treatment. Most ovarian cysts go away on their own, are not cancerous (are benign), and do not cause problems. Common types of ovarian cysts include:  Functional (follicle) cysts. ? Occur during the menstrual cycle, and usually go away with the next menstrual cycle if you do not get pregnant. ? Usually cause no symptoms.  Endometriomas. ? Are cysts that form from the tissue that lines the uterus (endometrium). ? Are sometimes called "chocolate cysts" because they become filled with blood that turns brown. ? Can cause pain in the lower abdomen during intercourse and during your period.  Cystadenoma cysts. ? Develop from cells on the outside surface of the ovary. ? Can get very large and cause lower abdomen pain and pain with intercourse. ? Can cause severe pain if they twist or break open (rupture).  Dermoid cysts. ? Are sometimes found in both ovaries. ? May contain different kinds of body tissue, such as skin, teeth, hair, or cartilage. ? Usually do not cause symptoms unless they get very big.  Theca lutein cysts. ? Occur when too much of a certain hormone (human chorionic gonadotropin) is produced and overstimulates the ovaries to produce an egg. ? Are most common after having procedures used to assist with the  conception of a baby (in vitro fertilization). What are the causes? Ovarian cysts may be caused by:  Ovarian hyperstimulation syndrome. This is a condition that can develop from taking fertility medicines. It causes multiple large ovarian cysts to form.  Polycystic ovarian syndrome (PCOS). This is a common hormonal disorder that can cause ovarian cysts, as well as problems with your period or fertility. What increases the risk? The following factors may make you more likely to develop ovarian cysts:  Being overweight or obese.  Taking fertility medicines.  Taking certain forms of hormonal birth control.  Smoking. What are the signs or symptoms? Many ovarian cysts do not cause symptoms. If symptoms are present, they may include:  Pelvic pain or pressure.  Pain in the lower abdomen.  Pain during sex.  Abdominal swelling.  Abnormal menstrual periods.  Increasing pain with menstrual periods. How is this diagnosed? These cysts are commonly found during a routine pelvic exam. You may have tests to find out more about the cyst, such as:  Ultrasound.  X-ray of the pelvis.  CT scan.  MRI.  Blood tests. How is this treated? Many ovarian cysts go away on their own without treatment. Your health care provider may want to check your cyst regularly for 2-3 months to see if it changes. If you are in menopause, it is especially important to have your cyst monitored closely because menopausal women have a higher rate of ovarian cancer. When treatment  is needed, it may include:  Medicines to help relieve pain.  A procedure to drain the cyst (aspiration).  Surgery to remove the whole cyst.  Hormone treatment or birth control pills. These methods are sometimes used to help dissolve a cyst. Follow these instructions at home:  Take over-the-counter and prescription medicines only as told by your health care provider.  Do not drive or use heavy machinery while taking prescription  pain medicine.  Get regular pelvic exams and Pap tests as often as told by your health care provider.  Return to your normal activities as told by your health care provider. Ask your health care provider what activities are safe for you.  Do not use any products that contain nicotine or tobacco, such as cigarettes and e-cigarettes. If you need help quitting, ask your health care provider.  Keep all follow-up visits as told by your health care provider. This is important. Contact a health care provider if:  Your periods are late, irregular, or painful, or they stop.  You have pelvic pain that does not go away.  You have pressure on your bladder or trouble emptying your bladder completely.  You have pain during sex.  You have any of the following in your abdomen: ? A feeling of fullness. ? Pressure. ? Discomfort. ? Pain that does not go away. ? Swelling.  You feel generally ill.  You become constipated.  You lose your appetite.  You develop severe acne.  You start to have more body hair and facial hair.  You are gaining weight or losing weight without changing your exercise and eating habits.  You think you may be pregnant. Get help right away if:  You have abdominal pain that is severe or gets worse.  You cannot eat or drink without vomiting.  You suddenly develop a fever.  Your menstrual period is much heavier than usual. This information is not intended to replace advice given to you by your health care provider. Make sure you discuss any questions you have with your health care provider. Document Released: 11/05/2005 Document Revised: 05/25/2016 Document Reviewed: 04/08/2016 Elsevier Interactive Patient Education  2019 [employer].

## 2019-02-04 ENCOUNTER — Other Ambulatory Visit: Payer: Self-pay

## 2019-02-04 ENCOUNTER — Ambulatory Visit: Payer: Self-pay | Admitting: *Deleted

## 2019-02-04 ENCOUNTER — Telehealth: Payer: Self-pay | Admitting: Physician Assistant

## 2019-02-04 ENCOUNTER — Encounter (HOSPITAL_BASED_OUTPATIENT_CLINIC_OR_DEPARTMENT_OTHER): Payer: Self-pay

## 2019-02-04 ENCOUNTER — Emergency Department (HOSPITAL_BASED_OUTPATIENT_CLINIC_OR_DEPARTMENT_OTHER)
Admission: EM | Admit: 2019-02-04 | Discharge: 2019-02-04 | Disposition: A | Discharge status: Home / Self Care | Payer: 59 | Attending: Emergency Medicine | Admitting: Emergency Medicine

## 2019-02-04 ENCOUNTER — Other Ambulatory Visit: Payer: Self-pay | Admitting: Physician Assistant

## 2019-02-04 ENCOUNTER — Emergency Department (HOSPITAL_BASED_OUTPATIENT_CLINIC_OR_DEPARTMENT_OTHER): Payer: 59

## 2019-02-04 ENCOUNTER — Encounter: Payer: Self-pay | Admitting: Physician Assistant

## 2019-02-04 DIAGNOSIS — R0789 Other chest pain: Secondary | ICD-10-CM | POA: Insufficient documentation

## 2019-02-04 DIAGNOSIS — R0602 Shortness of breath: Secondary | ICD-10-CM | POA: Insufficient documentation

## 2019-02-04 DIAGNOSIS — Z79899 Other long term (current) drug therapy: Secondary | ICD-10-CM | POA: Diagnosis not present

## 2019-02-04 DIAGNOSIS — R6883 Chills (without fever): Secondary | ICD-10-CM | POA: Diagnosis not present

## 2019-02-04 DIAGNOSIS — R079 Chest pain, unspecified: Secondary | ICD-10-CM

## 2019-02-04 LAB — CBC WITH DIFFERENTIAL/PLATELET
ABS IMMATURE GRANULOCYTES: 0.01 10*3/uL (ref 0.00–0.07)
BASOS ABS: 0 10*3/uL (ref 0.0–0.1)
Basophils Relative: 0 %
Eosinophils Absolute: 0.2 10*3/uL (ref 0.0–0.5)
Eosinophils Relative: 2 %
HCT: 43.5 % (ref 36.0–46.0)
Hemoglobin: 13.6 g/dL (ref 12.0–15.0)
IMMATURE GRANULOCYTES: 0 %
LYMPHS PCT: 32 %
Lymphs Abs: 3 10*3/uL (ref 0.7–4.0)
MCH: 27.3 pg (ref 26.0–34.0)
MCHC: 31.3 g/dL (ref 30.0–36.0)
MCV: 87.3 fL (ref 80.0–100.0)
Monocytes Absolute: 0.7 10*3/uL (ref 0.1–1.0)
Monocytes Relative: 7 %
NEUTROS ABS: 5.6 10*3/uL (ref 1.7–7.7)
NEUTROS PCT: 59 %
NRBC: 0 % (ref 0.0–0.2)
Platelets: 293 10*3/uL (ref 150–400)
RBC: 4.98 MIL/uL (ref 3.87–5.11)
RDW: 12.7 % (ref 11.5–15.5)
WBC: 9.5 10*3/uL (ref 4.0–10.5)

## 2019-02-04 LAB — COMPREHENSIVE METABOLIC PANEL
ALK PHOS: 76 U/L (ref 38–126)
ALT: 18 U/L (ref 0–44)
AST: 16 U/L (ref 15–41)
Albumin: 3.7 g/dL (ref 3.5–5.0)
Anion gap: 8 (ref 5–15)
BILIRUBIN TOTAL: 0.7 mg/dL (ref 0.3–1.2)
BUN: 9 mg/dL (ref 6–20)
CALCIUM: 8.7 mg/dL — AB (ref 8.9–10.3)
CO2: 23 mmol/L (ref 22–32)
CREATININE: 0.67 mg/dL (ref 0.44–1.00)
Chloride: 106 mmol/L (ref 98–111)
Glucose, Bld: 89 mg/dL (ref 70–99)
Potassium: 3.8 mmol/L (ref 3.5–5.1)
Sodium: 137 mmol/L (ref 135–145)
Total Protein: 7.7 g/dL (ref 6.5–8.1)

## 2019-02-04 LAB — URINE CULTURE
MICRO NUMBER:: 323915
SPECIMEN QUALITY:: ADEQUATE

## 2019-02-04 LAB — D-DIMER, QUANTITATIVE (NOT AT ARMC)

## 2019-02-04 LAB — TROPONIN I

## 2019-02-04 MED ORDER — AMOXICILLIN-POT CLAVULANATE 875-125 MG PO TABS
1.0000 | ORAL_TABLET | Freq: Two times a day (BID) | ORAL | 0 refills | Status: DC
Start: 2019-02-04 — End: 2019-02-20

## 2019-02-04 NOTE — ED Triage Notes (Signed)
Pt states awoke 2am with chills and shortness of breath.  Denies fever, mild chest pain.  Denies cough.  Took nyquil, went back to bed, awoke with same symptoms.

## 2019-02-04 NOTE — Telephone Encounter (Signed)
Pt called with shortness of breath that started about 3:40 this morning. She stated she had chills but  no fever. So she took Nyquil and it helped her chills.  She had denied any travel in the [place] or out. No dizziness, headache or cough. She can feel the shortness of breath without exertion but does not get worst with exertion. She has a physical already scheduled for this morning.  Flow at [facility] [place] notified regarding appointment. Pt advised to keep appointment.   Reason for Disposition . [1] MILD difficulty breathing (e.g., minimal/no SOB at rest, SOB with walking, pulse <100) AND [2] NEW-onset or WORSE than normal  Answer Assessment - Initial Assessment Questions 1. RESPIRATORY STATUS: "Describe your breathing?" (e.g., wheezing, shortness of breath, unable to speak, severe coughing)      Shortness of breath 2. ONSET: "When did this breathing problem begin?"      Very early this morning around 3:40 3. PATTERN "Does the difficult breathing come and go, or has it been constant since it started?"      constant 4. SEVERITY: "How bad is your breathing?" (e.g., mild, moderate, severe)    - MILD: No SOB at rest, mild SOB with walking, speaks normally in sentences, can lay down, no retractions, pulse < 100.    - MODERATE: SOB at rest, SOB with minimal exertion and prefers to sit, cannot lie down flat, speaks in phrases, mild retractions, audible wheezing, pulse 100-120.    - SEVERE: Very SOB at rest, speaks in single words, struggling to breathe, sitting hunched forward, retractions, pulse > 120      mild 5. RECURRENT SYMPTOM: "Have you had difficulty breathing before?" If so, ask: "When was the last time?" and "What happened that time?"      no 6. CARDIAC HISTORY: "Do you have any history of heart disease?" (e.g., heart attack, angina, bypass surgery, angioplasty)      no 7. LUNG HISTORY: "Do you have any history of lung disease?"  (e.g., pulmonary embolus, asthma, emphysema)      no 8. CAUSE: "What do you think is causing the breathing problem?"      no 9. OTHER SYMPTOMS: "Do you have any other symptoms? (e.g., dizziness, runny nose, cough, chest pain, fever)     Chest heaviness 10. PREGNANCY: "Is there any chance you are pregnant?" "When was your last menstrual period?"       No LMP 01/13/19 11. TRAVEL: "Have you traveled out of the country in the last month?" (e.g., travel history, exposures)       no  Protocols used: BREATHING DIFFICULTY-A-AH

## 2019-02-04 NOTE — ED Notes (Signed)
Denies cough, SOB. Endorses chills.

## 2019-02-04 NOTE — Telephone Encounter (Signed)
Pt given lab results per notes of Alfonse Ras PA on 3/16 2020. Pt verbalized understanding. Pt has started augmentin

## 2019-02-04 NOTE — Telephone Encounter (Signed)
Agree with plan 

## 2019-02-04 NOTE — ED Notes (Signed)
Patient transported to X-ray 

## 2019-02-04 NOTE — Telephone Encounter (Signed)
See note

## 2019-02-04 NOTE — ED Notes (Signed)
ED Provider at bedside. 

## 2019-02-04 NOTE — ED Provider Notes (Signed)
[facility] EMERGENCY DEPARTMENT Provider Note   CSN: [id number] Arrival date & time: 02/04/19  0909    History   Chief Complaint Chief Complaint  Patient presents with  . Chills  . Shortness of Breath    HPI Kelsey Curtis is a 28 y.o. female.     HPI  Woke up this morning with chills, shortness of breath, slight chest heaviness Tried to check temperature but didn't have fever Chest pain felt like heaviness. Was laying down, nothing made it better or worse.  No longer having CP, was having it at 3AM. No wheezing.  No leg pain or swelling.  On OCPs. No hx of DVT/PE. No long trips. No recent surgeries.  No nausea or vomiting.  No cough.  No body aches. No known sick contacts. 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    Past Medical History:  Diagnosis Date  . Pyelonephritis     Patient Active Problem List   Diagnosis Date Noted  . 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 No pertinent surgical history.   OB History   No obstetric history on file.      Home Medications    Prior to Admission medications   Medication Sig Start Date End Date Taking? Authorizing Provider  JUNEL FE 1/20 1-20 MG-MCG tablet  11/16/16  Yes [provider]  amoxicillin-clavulanate (AUGMENTIN) 875-125 MG tablet Take 1 tablet by mouth 2 (two) times daily. 02/04/19   Jarold Motto, PA  busPIRone (BUSPAR) 5 MG tablet Take 1 tablet (5 mg total) by mouth 3 (three) times daily as needed. 08/27/18   Jarold Motto, PA  naproxen sodium (ALEVE) 220 MG tablet Take 440 mg by mouth as needed.    [provider]  tamsulosin (FLOMAX) 0.4 MG CAPS capsule Take 1 capsule (0.4 mg total) by mouth daily. 02/02/19   Jarold Motto, PA  valACYclovir (VALTREX) 1000 MG tablet Take two tablets ( total 2000 mg) by mouth q12h x 1 day; Start: ASAP after symptom onset 01/02/19   Jarold Motto, PA    Family History Family History  Problem Relation Age of Onset   . Alzheimer's disease Maternal Grandmother   . Colon cancer Neg Hx   . Breast cancer Neg Hx     Social History Social History   Tobacco Use  . Smoking status: Never Smoker  . Smokeless tobacco: Never Used  Substance Use Topics  . Alcohol use: Yes    Comment: Occ.  . Drug use: No     Allergies   Patient has no known allergies.   Review of Systems Review of Systems  Constitutional: Positive for chills. Negative for fever.  HENT: Negative for congestion and sore throat.   Eyes: Negative for visual disturbance.  Respiratory: Positive for shortness of breath. Negative for cough.   Cardiovascular: Positive for chest pain.  Gastrointestinal: Negative for abdominal pain, diarrhea, nausea and vomiting.  Genitourinary: Negative for difficulty urinating and dysuria.  Musculoskeletal: Negative for back pain and neck pain.  Skin: Negative for rash.  Neurological: Negative for syncope and headaches.     Physical Exam Updated Vital Signs BP 119/74   Pulse 70   Temp 98 F (36.7 C)   Resp (!) 24   Ht 5\' 4"  (1.626 m)   Wt 102.1 kg   LMP 01/13/2019   SpO2 99%   BMI 38.62 kg/m   Physical Exam Vitals signs and nursing note reviewed.  Constitutional:  General: She is not in acute distress.    Appearance: She is well-developed. She is not diaphoretic.  HENT:     Head: Normocephalic and atraumatic.  Eyes:     Conjunctiva/sclera: Conjunctivae normal.  Neck:     Musculoskeletal: Normal range of motion.  Cardiovascular:     Rate and Rhythm: Normal rate and regular rhythm.     Heart sounds: Normal heart sounds. No murmur. No friction rub. No gallop.   Pulmonary:     Effort: Pulmonary effort is normal. No respiratory distress.     Breath sounds: Normal breath sounds. No wheezing or rales.  Abdominal:     General: There is no distension.     Palpations: Abdomen is soft.     Tenderness: There is no abdominal tenderness. There is no guarding.  Musculoskeletal:         General: No tenderness.  Skin:    General: Skin is warm and dry.     Findings: No erythema or rash.  Neurological:     Mental Status: She is alert and oriented to person, place, and time.      ED Treatments / Results  Labs (all labs ordered are listed, but only abnormal results are displayed) Labs Reviewed  COMPREHENSIVE METABOLIC PANEL - Abnormal; Notable for the following components:      Result Value   Calcium 8.7 (*)    All other components within normal limits  CBC WITH DIFFERENTIAL/PLATELET  TROPONIN I  D-DIMER, QUANTITATIVE (NOT AT [facility])    EKG EKG Interpretation  Date/Time:  Wednesday February 04 2019 10:52:55 EDT Ventricular Rate:  69 PR Interval:    QRS Duration: 87 QT Interval:  384 QTC Calculation: 412 R Axis:   31 Text Interpretation:  Sinus rhythm Borderline T abnormalities, anterior leads No previous ECGs available Confirmed by Alvira Monday ([id number]) on 02/04/2019 11:18:30 AM Also confirmed by Alvira Monday ([id number]), editor Barbette Hair [phone])  on 02/04/2019 11:47:30 AM   Radiology Dg Chest 2 View  Result Date: 02/04/2019 CLINICAL DATA:  Shortness of breath, chest pain and chills. EXAM: CHEST - 2 VIEW COMPARISON:  None. FINDINGS: The heart size and mediastinal contours are within normal limits. Mild density at the right lung base likely reflects atelectasis. There is no evidence of pulmonary edema, consolidation, pneumothorax, nodule or pleural fluid. The visualized skeletal structures are unremarkable. IMPRESSION: Opacity at the right lung base likely represents atelectasis. Electronically Signed   By: Irish Lack M.D.   On: 02/04/2019 10:59    Procedures Procedures (including critical care time)  Medications Ordered in ED Medications - No data to display   Initial Impression / Assessment and Plan / ED Course  I have reviewed the triage vital signs and the nursing notes.  Pertinent labs & imaging results that were available during my care of  the patient were reviewed by me and considered in my medical decision making (see chart for details).        28yo female with no significant medical history presents with chills, shortness of breath and chest pain. Afebrile at home and on arrival here. Sick contacts at work with known influenza, however no cough, no body aches, afebrile. Does not meet COVID testing criteria.   DDx includes pneumonia, URI, PE, ACS, anemia. EKG shows no acute ischemia, no pericarditis. CXR with area likely representing atelectasis.  No anemia.  CMP WNL.  Troponin negative after greater than 6 hours, no current pain, low risk HEART score and low suspicion for  ACS.  DDimer negative and low risk Wells, low suspicion for PE. Recommend PCP follow up.   Final Clinical Impressions(s) / ED Diagnoses   Final diagnoses:  Shortness of breath  Chest pain, unspecified type    ED Discharge Orders    None       Alvira Monday, MD 02/04/19 2137

## 2019-02-04 NOTE — Telephone Encounter (Signed)
Questions for Screening COVID-19  Symptom onset: Started last night felt feverish and chills and sob started after that. No history of asthma or any other lung issues   Travel or Contacts: No, works at the [employer] center. Has had people out with flu symptoms. Some have traveled out of area.   During this illness, did/does the patient experience any of the following symptoms? Fever >100.88F []   Yes [x]   No []   Unknown Subjective fever (felt feverish) [x]   Yes []   No []   Unknown Chills [x]   Yes []   No []   Unknown Muscle aches (myalgia) []   Yes [x]   No []   Unknown Runny nose (rhinorrhea) []   Yes [x]   No []   Unknown Sore throat []   Yes [x]   No []   Unknown Cough (new onset or worsening of chronic cough) [x]   Yes []   No []   Unknown Shortness of breath (dyspnea) [x]   Yes []   No []   Unknown Nausea or vomiting []   Yes [x]   No []   Unknown Headache [x]   Yes []   No []   Unknown Abdominal pain  []   Yes [x]   No []   Unknown Diarrhea (?3 loose/looser than normal stools/24hr period) []   Yes [x]   No []   Unknown Other, specify:_____________________________________________   Patient risk factors: Smoker? []   Current []   Former [x]   Never If female, currently pregnant? []   Yes [x]   No  Patient Active Problem List   Diagnosis Date Noted  . 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 Call ahead.  []   High risk for COVID-19 but stable will have car visit. Inform provider and coordinate time. Will be completed in afternoon. []   No red flags but URI signs or symptoms will go through side door and be seen in dedicated room.  Note: Referral to telemedicine is an appropriate alternative disposition for higher risk but stable. [facility]/e-Visit: [phone]. Patient will be going to [facility]. Have called and let Doctor in charge know on the way. Answered all questions and gave  information that they have requested.

## 2019-02-20 ENCOUNTER — Ambulatory Visit (INDEPENDENT_AMBULATORY_CARE_PROVIDER_SITE_OTHER): Payer: 59 | Admitting: Physician Assistant

## 2019-02-20 ENCOUNTER — Encounter: Payer: Self-pay | Admitting: Physician Assistant

## 2019-02-20 ENCOUNTER — Other Ambulatory Visit: Payer: Self-pay | Admitting: Physician Assistant

## 2019-02-20 DIAGNOSIS — N912 Amenorrhea, unspecified: Secondary | ICD-10-CM

## 2019-02-20 DIAGNOSIS — R300 Dysuria: Secondary | ICD-10-CM

## 2019-02-20 LAB — POCT URINALYSIS DIPSTICK
Bilirubin, UA: NEGATIVE
Blood, UA: NEGATIVE
Glucose, UA: NEGATIVE
Ketones, UA: NEGATIVE
Nitrite, UA: NEGATIVE
Protein, UA: NEGATIVE
Spec Grav, UA: 1.025 (ref 1.010–1.025)
Urobilinogen, UA: 0.2 E.U./dL
pH, UA: 6 (ref 5.0–8.0)

## 2019-02-20 LAB — POCT URINE PREGNANCY: Preg Test, Ur: NEGATIVE

## 2019-02-20 MED ORDER — CIPROFLOXACIN HCL 250 MG PO TABS: 250.0000 mg | ORAL_TABLET | Freq: Two times a day (BID) | ORAL | 0 refills | Status: DC

## 2019-02-20 NOTE — Progress Notes (Signed)
Virtual Visit via Video   I connected with Kelsey Curtis on 02/20/19 at 10:40 AM EDT by a video enabled telemedicine application and verified that I am speaking with the correct person using two identifiers. Location patient: Home Location provider: [facility], Office Persons participating in the virtual visit: Mayrene Megginson, Jarold Motto, [place], Corky Mull, LPN  I discussed the limitations of evaluation and management by telemedicine and the availability of in person appointments. The patient expressed understanding and agreed to proceed.  Subjective:  I acted as a Neurosurgeon for [employer], PA-C [employer], LPN  HPI:  Dysuria Pt c/o dysuria, frequency with urination and difficulty emptying bladder the past 2 days. Pt denies fever, back pain or flank pain. Pt finished Augmentin 2 days ago and she does not feel infection cleared up.  ROS: See pertinent positives and negatives per HPI.  Patient Active Problem List   Diagnosis Date Noted  . 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 Smoking status: Never Smoker  . Smokeless tobacco: Never Used  Substance Use Topics  . Alcohol use: Yes    Comment: Occ.    Current Outpatient Medications:  .  busPIRone (BUSPAR) 5 MG tablet, Take 1 tablet (5 mg total) by mouth 3 (three) times daily as needed., Disp: 60 tablet, Rfl: 1 .  JUNEL FE 1/20 1-20 MG-MCG tablet, , Disp: , Rfl:  .  naproxen sodium (ALEVE) 220 MG tablet, Take 440 mg by mouth as needed., Disp: , Rfl:  .  valACYclovir (VALTREX) 1000 MG tablet, Take two tablets ( total 2000 mg) by mouth q12h x 1 day; Start: ASAP after symptom onset, Disp: 6 tablet, Rfl: 2  No Known Allergies  Objective:   VITALS: Per patient if applicable, see vitals. GENERAL: Alert, appears well and in no acute distress. HEENT: Atraumatic, conjunctiva clear, no obvious abnormalities on inspection of external nose and ears. NECK: Normal movements of the head and  neck. CARDIOPULMONARY: No increased WOB. Speaking in clear sentences. I:E ratio WNL.  MS: Moves all visible extremities without noticeable abnormality. PSYCH: Pleasant and cooperative, well-groomed. Speech normal rate and rhythm. Affect is appropriate. Insight and judgement are appropriate. Attention is focused, linear, and appropriate.  NEURO: CN grossly intact. Oriented as arrived to appointment on time with no prompting. Moves both UE equally.  SKIN: No obvious lesions, wounds, erythema, or cyanosis noted on face or hands.  Results for orders placed or performed in visit on 02/20/19  POCT urinalysis dipstick  Result Value Ref Range   Color, UA Yellow    Clarity, UA Cloudy    Glucose, UA Negative Negative   Bilirubin, UA Negative    Ketones, UA Negative    Spec Grav, UA 1.025 1.010 - 1.025   Blood, UA Negative    pH, UA 6.0 5.0 - 8.0   Protein, UA Negative Negative   Urobilinogen, UA 0.2 0.2 or 1.0 E.U./dL   Nitrite, UA Negative    Leukocytes, UA Small (1+) (A) Negative   Appearance     Odor    POCT urine pregnancy  Result Value Ref Range   Preg Test, Ur Negative Negative     Assessment and Plan:   Cozie was seen today for dysuria.  Diagnoses and all orders for this visit:  Dysuria Will treat with antibiotic, cipro, based on prior culture and potential failure of treatment with augmentin. Culture pending. Worsening precautions advised. -     POCT urinalysis dipstick -  Urine Culture  Amenorrhea Urine preg negative. -     POCT urine pregnancy    . Reviewed expectations re: course of current medical issues. . Discussed self-management of symptoms. . Outlined signs and symptoms indicating need for more acute intervention. . Patient verbalized understanding and all questions were answered. [name] Health Maintenance issues including appropriate healthy diet, exercise, and smoking avoidance were discussed with patient.  . See orders for this visit as documented in  the electronic medical record. I discussed the assessment and treatment plan with the patient. The patient was provided an opportunity to ask questions and all were answered. The patient agreed with the plan and demonstrated an understanding of the instructions.   The patient was advised to call back or seek an in-person evaluation if the symptoms worsen or if the condition fails to improve as anticipated.       [place], [place] 02/20/2019

## 2019-02-22 LAB — URINE CULTURE
MICRO NUMBER:: 374138
SPECIMEN QUALITY:: ADEQUATE

## 2019-02-24 ENCOUNTER — Other Ambulatory Visit: Payer: Self-pay | Admitting: Physician Assistant

## 2019-04-07 LAB — HM PAP SMEAR

## 2019-05-18 ENCOUNTER — Telehealth: Payer: Self-pay | Admitting: Physician Assistant

## 2019-05-18 ENCOUNTER — Encounter: Payer: Self-pay | Admitting: Physician Assistant

## 2019-05-18 ENCOUNTER — Ambulatory Visit (INDEPENDENT_AMBULATORY_CARE_PROVIDER_SITE_OTHER): Payer: 59 | Admitting: Physician Assistant

## 2019-05-18 DIAGNOSIS — R6889 Other general symptoms and signs: Secondary | ICD-10-CM | POA: Diagnosis not present

## 2019-05-18 DIAGNOSIS — Z20822 Contact with and (suspected) exposure to covid-19: Secondary | ICD-10-CM

## 2019-05-18 DIAGNOSIS — Z20828 Contact with and (suspected) exposure to other viral communicable diseases: Secondary | ICD-10-CM

## 2019-05-18 DIAGNOSIS — Z7189 Other specified counseling: Secondary | ICD-10-CM

## 2019-05-18 NOTE — Telephone Encounter (Signed)
Please call patient and schedule COVID-19 testing.  Inda Coke PA-C

## 2019-05-18 NOTE — Progress Notes (Signed)
Virtual Visit via Video   I connected with Kelsey Curtis on 05/18/19 at 11:40 AM EDT by a video enabled telemedicine application and verified that I am speaking with the correct person using two identifiers. Location patient: Home Location provider: [facility], Office Persons participating in the virtual visit: [place], Sample PA-C  I discussed the limitations of evaluation and management by telemedicine and the availability of in person appointments. The patient expressed understanding and agreed to proceed.   Subjective:   HPI:   Patient works for the [employer].  She had a confirmed positive COVID-19 contact in her office. Patient developed symptoms on Monday June 22. She had fever x 3 days (highest was 101), cough, HA, chills. Denies diarrhea, severe SOB, chest pain.  ROS: See pertinent positives and negatives per HPI.  Patient Active Problem List   Diagnosis Date Noted  . 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 Smoking status: Never Smoker  . Smokeless tobacco: Never Used  Substance Use Topics  . Alcohol use: Yes    Comment: Occ.    Current Outpatient Medications:  .  busPIRone (BUSPAR) 5 MG tablet, Take 1 tablet (5 mg total) by mouth 3 (three) times daily as needed., Disp: 60 tablet, Rfl: 1 .  ciprofloxacin (CIPRO) 250 MG tablet, Take 1 tablet (250 mg total) by mouth 2 (two) times daily., Disp: 6 tablet, Rfl: 0 .  JUNEL FE 1/20 1-20 MG-MCG tablet, , Disp: , Rfl:  .  naproxen sodium (ALEVE) 220 MG tablet, Take 440 mg by mouth as needed., Disp: , Rfl:  .  valACYclovir (VALTREX) 1000 MG tablet, Take two tablets ( total 2000 mg) by mouth q12h x 1 day; Start: ASAP after symptom onset, Disp: 6 tablet, Rfl: 2  No Known Allergies  Objective:   VITALS: Per patient if applicable, see vitals. GENERAL: Alert, appears well and in no acute distress. HEENT: Atraumatic, conjunctiva clear, no obvious abnormalities on  inspection of external nose and ears. NECK: Normal movements of the head and neck. CARDIOPULMONARY: No increased WOB. Speaking in clear sentences. I:E ratio WNL.  MS: Moves all visible extremities without noticeable abnormality. PSYCH: Pleasant and cooperative, well-groomed. Speech normal rate and rhythm. Affect is appropriate. Insight and judgement are appropriate. Attention is focused, linear, and appropriate.  NEURO: CN grossly intact. Oriented as arrived to appointment on time with no prompting. Moves both UE equally.  SKIN: No obvious lesions, wounds, erythema, or cyanosis noted on face or hands.  Assessment and Plan:   Diagnoses and all orders for this visit:  Suspected Covid-19 Virus Infection  Advice Given About Covid-19 Virus Infection   Patient has a respiratory illness without signs of acute distress or respiratory compromise at this time. This is likely a viral infection, which can come from a number of respiratory viruses. At this time, I am going to send to her to drive-thru testing site, via the [facility]. As a precaution, they have been advised to remain home for 2 weeks from symptom onset. Advised if they experience a "second sickening" or worsening symptoms as the illness progresses, they are to call the office for further instructions or seek emergent evaluation for any severe symptoms.    . Reviewed expectations re: course of current medical issues. . Discussed self-management of symptoms. . Outlined signs and symptoms indicating need for more acute intervention. . Patient verbalized understanding and all questions were answered. [name] Health Maintenance issues including appropriate  healthy diet, exercise, and smoking avoidance were discussed with patient. . See orders for this visit as documented in the electronic medical record.  I discussed the assessment and treatment plan with the patient. The patient was provided an opportunity to ask questions and all were answered. The  patient agreed with the plan and demonstrated an understanding of the instructions.   The patient was advised to call back or seek an in-person evaluation if the symptoms worsen or if the condition fails to improve as anticipated.   CMA or LPN served as scribe during this visit. History, Physical, and Plan performed by medical provider. The above documentation has been reviewed and is accurate and complete.   Kelsey Curtis, [place] 05/18/2019

## 2019-05-18 NOTE — Telephone Encounter (Signed)
Scheduled patient for COVID 19 test tomorrow 05/19/2019 at 10 am at [facility] per her request.  Testing protocol reviewed.

## 2019-05-18 NOTE — Addendum Note (Signed)
Addended by: Denyce Robert on: 05/18/2019 11:06 AM   Modules accepted: Orders

## 2019-05-19 ENCOUNTER — Other Ambulatory Visit: Payer: Self-pay

## 2019-05-19 ENCOUNTER — Encounter (INDEPENDENT_AMBULATORY_CARE_PROVIDER_SITE_OTHER): Payer: Self-pay

## 2019-05-19 DIAGNOSIS — Z20822 Contact with and (suspected) exposure to covid-19: Secondary | ICD-10-CM

## 2019-05-20 ENCOUNTER — Encounter (INDEPENDENT_AMBULATORY_CARE_PROVIDER_SITE_OTHER): Payer: Self-pay

## 2019-05-22 ENCOUNTER — Encounter (INDEPENDENT_AMBULATORY_CARE_PROVIDER_SITE_OTHER): Payer: Self-pay

## 2019-05-23 ENCOUNTER — Encounter (INDEPENDENT_AMBULATORY_CARE_PROVIDER_SITE_OTHER): Payer: Self-pay

## 2019-05-24 ENCOUNTER — Encounter (INDEPENDENT_AMBULATORY_CARE_PROVIDER_SITE_OTHER): Payer: Self-pay

## 2019-05-25 ENCOUNTER — Encounter (INDEPENDENT_AMBULATORY_CARE_PROVIDER_SITE_OTHER): Payer: Self-pay

## 2019-05-25 LAB — NOVEL CORONAVIRUS, NAA: SARS-CoV-2, NAA: NOT DETECTED

## 2019-06-15 ENCOUNTER — Encounter: Payer: Self-pay | Admitting: Physician Assistant

## 2019-06-15 ENCOUNTER — Ambulatory Visit (INDEPENDENT_AMBULATORY_CARE_PROVIDER_SITE_OTHER): Payer: 59 | Admitting: Physician Assistant

## 2019-06-15 DIAGNOSIS — R6882 Decreased libido: Secondary | ICD-10-CM | POA: Diagnosis not present

## 2019-06-15 DIAGNOSIS — F419 Anxiety disorder, unspecified: Secondary | ICD-10-CM | POA: Diagnosis not present

## 2019-06-15 MED ORDER — TRAZODONE HCL 50 MG PO TABS: 25.0000 mg | ORAL_TABLET | Freq: Every evening | ORAL | 3 refills | Status: DC | PRN

## 2019-06-15 NOTE — Progress Notes (Signed)
Virtual Visit via Video   I connected with Kelsey Curtis on 06/15/19 at 10:00 AM EDT by a video enabled telemedicine application and verified that I am speaking with the correct person using two identifiers. Location patient: Home Location provider: [facility], Office Persons participating in the virtual visit: Harle StanfordGabrielle Curtis, Maybree Riling PA-C.   I discussed the limitations of evaluation and management by telemedicine and the availability of in person appointments. The patient expressed understanding and agreed to proceed.  I acted as a Neurosurgeonscribe for [employer] CorporationSamantha Marlee Trentman, PA-C [employer]arkDonna Orphanos, LPN.  Subjective:   HPI:  Low Libido Pt c/o no sexual drive for the past 3 months, recently married last year. Her OCP's were switched 2 months ago but no change in sexual desire. She was changed to a higher estrogen-containing pill by her ob-gyn. She has decreased arousal, longer time to orgasm, and feels like she has decreased vaginal lubrication. She states that her ob-gyn is considering starting her on Addyi.  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 Pt was on Buspar 5 mg TID but ran out awhile ago. She is constantly worried about work. She is not sleeping. Denies SI/HI. She thinks that this is affecting her libido.  GAD 7 : Generalized Anxiety Score 06/15/2019 08/27/2018  Nervous, Anxious, on Edge 1 3  Control/stop worrying 1 3  Worry too much - different things 3 3  Trouble relaxing 3 2  Restless 1 3  Easily annoyed or irritable 3 3  Afraid - awful might happen 3 2  Total GAD 7 Score 15 19  Anxiety Difficulty Somewhat difficult Extremely difficult   ROS: See pertinent positives and negatives per HPI.  Patient Active Problem List   Diagnosis Date Noted  . 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 Smoking status: Never Smoker  . Smokeless  tobacco: Never Used  Substance Use Topics  . Alcohol use: Yes    Comment: Occ.    Current Outpatient Medications:  .  APRI 0.15-30 MG-MCG tablet, , Disp: , Rfl:  .  naproxen sodium (ALEVE) 220 MG tablet, Take 440 mg by mouth as needed., Disp: , Rfl:  .  valACYclovir (VALTREX) 1000 MG tablet, Take two tablets ( total 2000 mg) by mouth q12h x 1 day; Start: ASAP after symptom onset, Disp: 6 tablet, Rfl: 2 .  VITAMIN D PO, Take 50 mg by mouth daily., Disp: , Rfl:  .  busPIRone (BUSPAR) 5 MG tablet, Take 1 tablet (5 mg total) by mouth 3 (three) times daily as needed. (Patient not taking: Reported on 06/15/2019), Disp: 60 tablet, Rfl: 1 .  traZODone (DESYREL) 50 MG tablet, Take 0.5-1 tablets (25-50 mg total) by mouth at bedtime as needed for sleep., Disp: 30 tablet, Rfl: 3  No Known Allergies  Objective:   VITALS: Per patient if applicable, see vitals. GENERAL: Alert, appears well and in no acute distress. HEENT: Atraumatic, conjunctiva clear, no obvious abnormalities on inspection of external nose and ears. NECK: Normal movements of the head and neck. CARDIOPULMONARY: No increased WOB. Speaking in clear sentences. I:E ratio WNL.  MS: Moves all visible extremities without noticeable abnormality. PSYCH: Pleasant and cooperative, well-groomed. Speech normal rate and rhythm. Affect is appropriate. Insight and judgement are appropriate. Attention is focused, linear, and appropriate.  NEURO: CN grossly intact. Oriented as arrived to appointment on time  with no prompting. Moves both UE equally.  SKIN: No obvious lesions, wounds, erythema, or cyanosis noted on face or hands.  Assessment and Plan:   [name] was seen today for low libido and anxiety.  Diagnoses and all orders for this visit:  Anxiety Uncontrolled. We discussed our options. She is agreeable to trying Trazodone to see if this would help her insomnia, prior to [place] starting an SSRI that could potentially worsen her libido. Follow-up  in 2 weeks, sooner if needed. Worsening precautions advised.  Decreased libido Uncontrolled. I think her anxiety is heavily playing a role. See above for plan. Further medication adjustment for changing OCP or adding Addyi per ob-gyn discretion.  Other orders -     traZODone (DESYREL) 50 MG tablet; Take 0.5-1 tablets (25-50 mg total) by mouth at bedtime as needed for sleep.   . Reviewed expectations re: course of current medical issues. . Discussed self-management of symptoms. . Outlined signs and symptoms indicating need for more acute intervention. . Patient verbalized understanding and all questions were answered. [name] Health Maintenance issues including appropriate healthy diet, exercise, and smoking avoidance were discussed with patient. . See orders for this visit as documented in the electronic medical record.  I discussed the assessment and treatment plan with the patient. The patient was provided an opportunity to ask questions and all were answered. The patient agreed with the plan and demonstrated an understanding of the instructions.   The patient was advised to call back or seek an in-person evaluation if the symptoms worsen or if the condition fails to improve as anticipated.   CMA or LPN served as scribe during this visit. History, Physical, and Plan performed by medical provider. The above documentation has been reviewed and is accurate and complete.  I spent 25 minutes with this patient, greater than 50% was face-to-face time counseling regarding the above diagnoses.  [place], [place] 06/15/2019

## 2019-07-07 ENCOUNTER — Other Ambulatory Visit: Payer: Self-pay | Admitting: Physician Assistant

## 2019-09-02 ENCOUNTER — Other Ambulatory Visit: Payer: Self-pay

## 2019-09-02 ENCOUNTER — Encounter: Payer: Self-pay | Admitting: Physician Assistant

## 2019-09-02 ENCOUNTER — Ambulatory Visit (INDEPENDENT_AMBULATORY_CARE_PROVIDER_SITE_OTHER): Payer: 59 | Admitting: Physician Assistant

## 2019-09-02 VITALS — BP 140/88 | HR 83 | Temp 97.5°F | Ht 64.0 in | Wt 236.2 lb

## 2019-09-02 DIAGNOSIS — F419 Anxiety disorder, unspecified: Secondary | ICD-10-CM

## 2019-09-02 DIAGNOSIS — R6882 Decreased libido: Secondary | ICD-10-CM | POA: Diagnosis not present

## 2019-09-02 DIAGNOSIS — K5900 Constipation, unspecified: Secondary | ICD-10-CM

## 2019-09-02 DIAGNOSIS — R1084 Generalized abdominal pain: Secondary | ICD-10-CM

## 2019-09-02 LAB — CBC WITH DIFFERENTIAL/PLATELET
Basophils Absolute: 0 10*3/uL (ref 0.0–0.1)
Basophils Relative: 0.2 % (ref 0.0–3.0)
Eosinophils Absolute: 0.1 10*3/uL (ref 0.0–0.7)
Eosinophils Relative: 1.1 % (ref 0.0–5.0)
HCT: 41.8 % (ref 36.0–46.0)
Hemoglobin: 13.7 g/dL (ref 12.0–15.0)
Lymphocytes Relative: 34.4 % (ref 12.0–46.0)
Lymphs Abs: 2.7 10*3/uL (ref 0.7–4.0)
MCHC: 32.8 g/dL (ref 30.0–36.0)
MCV: 85.4 fl (ref 78.0–100.0)
Monocytes Absolute: 0.6 10*3/uL (ref 0.1–1.0)
Monocytes Relative: 7.5 % (ref 3.0–12.0)
Neutro Abs: 4.5 10*3/uL (ref 1.4–7.7)
Neutrophils Relative %: 56.8 % (ref 43.0–77.0)
Platelets: 309 10*3/uL (ref 150.0–400.0)
RBC: 4.9 Mil/uL (ref 3.87–5.11)
RDW: 13 % (ref 11.5–15.5)
WBC: 8 10*3/uL (ref 4.0–10.5)

## 2019-09-02 LAB — POCT URINE PREGNANCY: Preg Test, Ur: NEGATIVE

## 2019-09-02 LAB — COMPREHENSIVE METABOLIC PANEL
ALT: 14 U/L (ref 0–35)
AST: 12 U/L (ref 0–37)
Albumin: 3.9 g/dL (ref 3.5–5.2)
Alkaline Phosphatase: 74 U/L (ref 39–117)
BUN: 8 mg/dL (ref 6–23)
CO2: 26 mEq/L (ref 19–32)
Calcium: 9.2 mg/dL (ref 8.4–10.5)
Chloride: 103 mEq/L (ref 96–112)
Creatinine, Ser: 0.74 mg/dL (ref 0.40–1.20)
GFR: 112.02 mL/min (ref >60.00)
Glucose, Bld: 86 mg/dL (ref 70–99)
Potassium: 3.9 mEq/L (ref 3.5–5.1)
Sodium: 137 mEq/L (ref 135–145)
Total Bilirubin: 0.4 mg/dL (ref 0.2–1.2)
Total Protein: 7.2 g/dL (ref 6.0–8.3)

## 2019-09-02 LAB — TSH: TSH: 1.1 u[IU]/mL (ref 0.35–4.50)

## 2019-09-02 NOTE — Progress Notes (Signed)
Kelsey Curtis is a 28 y.o. female here for a follow up of a pre-existing problem.  History of Present Illness:   Chief Complaint  Patient presents with  . stomach issues    pt thinks she has IBS, bad stomach pain thats recurring  . constipated  . gassy  . painful sex    HPI  Constipation/abdominal pain Patient reports that she has had ongoing constipation.  She has trialed intermittent MiraLAX, Dulcolax, prune juice without long-term result.  She is not eating much fiber, does admit to eating high fat foods.  She is sedentary.  She was drinking much more water than she is now, she is only drinking about 20 ounces of water a day.  She is having difficulty passing bowel movements, and feels like she needs to strain.  Denies rectal bleeding.  Does not have any concerns for hemorrhoids at this time.  She is also having urinary frequency because of this and also pelvic pain whenever she attempts to have sex with her husband.  Decreased libido Ongoing issues with decreased libido, she thinks that this is a result of her uncontrolled anxiety.  She has talked to her OB/GYN in the past about possible prescription medication, Addyi.  She has not followed up with her OB/GYN regarding this.  Anxiety  Ongoing issues regarding her health.  She did get a different job, but she still feels like her anxiety is uncontrolled at times.  Currently she is not sleeping due to abdominal pain, but does have a history of insomnia.  Takes BuSpar 3 times a day as needed and also will take trazodone for insomnia.  Past Medical History:  Diagnosis Date  . Pyelonephritis      Social History   Socioeconomic History  . Marital status: Single    Spouse name: Not on file  . Number of children: Not on file  . Years of education: Not on file  . Highest education level: Not on file  Occupational History  . Not on file  Social Needs  . Financial resource strain: Not on file  . Food insecurity    Worry: Not  on file    Inability: Not on file  . Transportation needs    Medical: Not on file    Non-medical: Not on file  Tobacco Use  . Smoking status: Never Smoker  . Smokeless tobacco: Never Used  Substance and Sexual Activity  . Alcohol use: Yes    Comment: Occ.  . Drug use: No  . Sexual activity: Yes    Birth control/protection: Pill  Lifestyle  . Physical activity    Days per week: Not on file    Minutes per session: Not on file  . Stress: Not on file  Relationships  . Social Musician on phone: Not on file    Gets together: Not on file    Attends religious service: Not on file    Active member of club or organization: Not on file    Attends meetings of clubs or organizations: Not on file    Relationship status: Not on file  . Intimate partner violence    Fear of current or ex partner: Not on file    Emotionally abused: Not on file    Physically abused: Not on file    Forced sexual activity: Not on file  Other Topics Concern  . 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 No pertinent surgical history.  Family History  Problem Relation Age of Onset  . Alzheimer's disease Maternal Grandmother   . Colon cancer Neg Hx   . Breast cancer Neg Hx     No Known Allergies  Current Medications:   Current Outpatient Medications:  .  APRI 0.15-30 MG-MCG tablet, , Disp: , Rfl:  .  busPIRone (BUSPAR) 5 MG tablet, Take 1 tablet (5 mg total) by mouth 3 (three) times daily as needed., Disp: 60 tablet, Rfl: 1 .  naproxen sodium (ALEVE) 220 MG tablet, Take 440 mg by mouth as needed., Disp: , Rfl:  .  traZODone (DESYREL) 50 MG tablet, TAKE 0.5-1 TABLETS (25-50 MG TOTAL) BY MOUTH AT BEDTIME AS NEEDED FOR SLEEP., Disp: 90 tablet, Rfl: 0 .  valACYclovir (VALTREX) 1000 MG tablet, Take two  tablets ( total 2000 mg) by mouth q12h x 1 day; Start: ASAP after symptom onset, Disp: 6 tablet, Rfl: 2 .  VITAMIN D PO, Take 50 mg by mouth daily., Disp: , Rfl:    Review of Systems:   ROS Negative unless otherwise specified per HPI.  Vitals:   Vitals:   09/02/19 0950  BP: 140/88  Pulse: 83  Temp: (!) 97.5 F (36.4 C)  SpO2: 99%  Weight: 236 lb 3.2 oz (107.1 kg)  Height: 5\' 4"  (1.626 m)     Body mass index is 40.54 kg/m.  Physical Exam:   Physical Exam Vitals signs and nursing note reviewed.  Constitutional:      General: She is not in acute distress.    Appearance: She is well-developed. She is not ill-appearing or toxic-appearing.  Cardiovascular:     Rate and Rhythm: Normal rate and regular rhythm.     Pulses: Normal pulses.     Heart sounds: Normal heart sounds, S1 normal and S2 normal.     Comments: No LE edema Pulmonary:     Effort: Pulmonary effort is normal.     Breath sounds: Normal breath sounds.  Abdominal:     General: Abdomen is flat. Bowel sounds are normal.     Palpations: Abdomen is soft.     Tenderness: There is generalized abdominal tenderness. There is no right CVA tenderness, left CVA tenderness, guarding or rebound.  Skin:    General: Skin is warm and dry.  Neurological:     Mental Status: She is alert.     GCS: GCS eye subscore is 4. GCS verbal subscore is 5. GCS motor subscore is 6.  Psychiatric:        Attention and Perception: Attention normal.        Mood and Affect: Mood is anxious.        Speech: Speech normal.        Behavior: Behavior normal. Behavior is cooperative.     Results for orders placed or performed in visit on 09/02/19  POCT urine pregnancy  Result Value Ref Range   Preg Test, Ur Negative Negative    Assessment and Plan:   Jerrel IvoryGabrielle was seen today for stomach issues, constipated, gassy and painful sex.  Diagnoses and all orders for this visit:  Constipation, unspecified constipation type; Generalized  abdominal pain Uncontrolled.  We discussed need to be more active, increase water intake significantly, and add in more fruits and vegetables and other high-fiber foods.  We are going to start her on a daily Colace and MiraLAX regimen to see if this will help her symptoms.  Recommend follow-up in 1 month so we can discuss her progress.  If symptoms worsen in the meantime she was instructed to call us. -     CBC with Differential/Platelet -     Comprehensive metabolic panel -     TSH -     POCT urine pregnancy  Decreased libido I think that this is due to her anxiety.  We will continue to try to treat her anxiety, but I did recommend that she reach out to her OB/GYN if she is interested in the prescription medication Addyi.  Anxiety Uncontrolled.  Recommended avoiding googling all of her symptoms. Continue buspar prn. Recommended that we follow-up in 1 month to discuss further.  Sooner if worsening symptoms in the meantime.  . Reviewed expectations re: course of current medical issues. . Discussed self-management of symptoms. . Outlined signs and symptoms indicating need for more acute intervention. . Patient verbalized understanding and all questions were answered. . See orders for this visit as documented in the electronic medical record. . Patient received an After-Visit Summary.  Inda Coke, PA-C

## 2019-09-02 NOTE — Patient Instructions (Signed)
It was great to see you!  Aim for AT LEAST 64 oz of water daily. Try to be more active. Fiber!!!  After constipation resolves, if pelvic issues/painful intercourse persists, lets consider pelvic floor physical therapy.  If constipation/stomach issues do not improve, let me know and we will get you to GI.  Let's follow-up in 1 month, sooner if you have concerns.  Take care,  Inda Coke PA-C

## 2019-09-08 ENCOUNTER — Telehealth: Payer: Self-pay

## 2019-09-08 ENCOUNTER — Encounter: Payer: Self-pay | Admitting: Physician Assistant

## 2019-09-08 NOTE — Telephone Encounter (Signed)
Spoke to pt told her okay to write note to be out of work till [employer] comes back. Pt verbalized understanding. Told pt please let us know your results when they come back and I will send note now to My Chart so you can access it. Pt verbalized understanding. Note done.

## 2019-09-08 NOTE — Telephone Encounter (Signed)
Please see message and advise 

## 2019-09-08 NOTE — Telephone Encounter (Signed)
Copied from [place] [id number]. Topic: General - Other >> Sep 08, 2019 11:40 AM Leward Quan A wrote: Reason for CRM: Patient called to say that her husband was in contact with someone that is coronavirus positive so she is going over to the testing site to be tested. 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 She would like a call back from [facility] and a note to stay out of work for the quarantine period please and can be reached on My Chart or Ph#  [phone]

## 2019-09-08 NOTE — Telephone Encounter (Signed)
Please call and see what patient is asking to discuss with me.  Okay to write note that states she should be absent from work until COVID-19 test returns.

## 2019-09-09 ENCOUNTER — Ambulatory Visit: Payer: 59 | Admitting: Physician Assistant

## 2019-09-10 ENCOUNTER — Other Ambulatory Visit: Payer: Self-pay

## 2019-09-10 DIAGNOSIS — Z20822 Contact with and (suspected) exposure to covid-19: Secondary | ICD-10-CM

## 2019-09-12 LAB — NOVEL CORONAVIRUS, NAA: SARS-CoV-2, NAA: NOT DETECTED

## 2019-09-15 ENCOUNTER — Encounter: Payer: Self-pay | Admitting: Physician Assistant

## 2019-09-17 ENCOUNTER — Encounter: Payer: Self-pay | Admitting: Physician Assistant

## 2019-09-17 ENCOUNTER — Ambulatory Visit (INDEPENDENT_AMBULATORY_CARE_PROVIDER_SITE_OTHER): Payer: 59 | Admitting: Physician Assistant

## 2019-09-17 ENCOUNTER — Other Ambulatory Visit: Payer: Self-pay

## 2019-09-17 VITALS — BP 110/64 | HR 68 | Temp 98.1°F | Ht 64.0 in | Wt 235.4 lb

## 2019-09-17 DIAGNOSIS — K5900 Constipation, unspecified: Secondary | ICD-10-CM

## 2019-09-17 DIAGNOSIS — R1084 Generalized abdominal pain: Secondary | ICD-10-CM

## 2019-09-17 MED ORDER — DICYCLOMINE HCL 10 MG PO CAPS: 10.0000 mg | ORAL_CAPSULE | Freq: Four times a day (QID) | ORAL | 0 refills | Status: DC | PRN

## 2019-09-17 NOTE — Patient Instructions (Signed)
You will be contacted by a referral to GI.  Consider taking the Bentyl for spasms in your stomach.  Try the peppermint capsules. Consider the fodmap diet. Start benefiber (see below)

## 2019-09-17 NOTE — Progress Notes (Signed)
Kelsey Curtis is a 28 y.o. female is here to discuss: Abdominal issues  I acted as a Education administrator for [employer], PA-C Kelsey Pickler, LPN  History of Present Illness:   Chief Complaint  Patient presents with  . Abdominal Cramping    HPI   Abdominal cramping Patient is here for follow-up of abdominal cramping and constipation.  She has tried eliminating dairy from her diet.  She has tried increasing her water intake to 40 ounces a day, even though our goal was 60 ounces a day.  She has tried Gas-X, a detox tea.  She only use the Colace and MiraLAX regimen for about a week.  She did not have significant improvement of her symptoms with this.  Her abdominal pain is causing her to miss work.  Denies: Rectal bleeding, unintentional weight loss, night sweats, fevers.  She states that her fiber intake consist of eating oatmeal every other day.  There are no preventive care reminders to display for this patient.  Past Medical History:  Diagnosis Date  . Pyelonephritis      Social History   Socioeconomic History  . Marital status: Single    Spouse name: Not on file  . Number of children: Not on file  . Years of education: Not on file  . Highest education level: Not on file  Occupational History  . Not on file  Social Needs  . Financial resource strain: Not on file  . Food insecurity    Worry: Not on file    Inability: Not on file  . Transportation needs    Medical: Not on file    Non-medical: Not on file  Tobacco Use  . Smoking status: Never Smoker  . Smokeless tobacco: Never Used  Substance and Sexual Activity  . Alcohol use: Yes    Comment: Occ.  . Drug use: No  . Sexual activity: Yes    Birth control/protection: Pill  Lifestyle  . Physical activity    Days per week: Not on file    Minutes per session: Not on file  . Stress: Not on file  Relationships  . Social Herbalist on phone: Not on file    Gets together: Not on file    Attends religious  service: Not on file    Active member of club or organization: Not on file    Attends meetings of clubs or organizations: Not on file    Relationship status: Not on file  . Intimate partner violence    Fear of current or ex partner: Not on file    Emotionally abused: Not on file    Physically abused: Not on file    Forced sexual activity: Not on file  Other Topics Concern  . Not on file  Social History Narrative   [employer] -- Market researcher, 2015   Going for her Masters -- mental health counseling, 2020; online school   Committed relationship - 2 years   No children   Fun: TBD    History reviewed. No pertinent surgical history.  Family History  Problem Relation Age of Onset  . Alzheimer's disease Maternal Grandmother   . Colon cancer Neg Hx   . Breast cancer Neg Hx     PMHx, SurgHx, SocialHx, FamHx, Medications, and Allergies were reviewed in the Visit Navigator and updated as appropriate.   Patient Active Problem List   Diagnosis Date Noted  . 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 Smoking status: Never Smoker  . Smokeless tobacco: Never Used  Substance Use Topics  . Alcohol use: Yes    Comment: Occ.  . Drug use: No    Current Medications and Allergies:    Current Outpatient Medications:  .  APRI 0.15-30 MG-MCG tablet, , Disp: , Rfl:  .  busPIRone (BUSPAR) 5 MG tablet, Take 1 tablet (5 mg total) by mouth 3 (three) times daily as needed., Disp: 60 tablet, Rfl: 1 .  naproxen sodium (ALEVE) 220 MG tablet, Take 440 mg by mouth as needed., Disp: , Rfl:  .  traZODone (DESYREL) 50 MG tablet, TAKE 0.5-1 TABLETS (25-50 MG TOTAL) BY MOUTH AT BEDTIME AS NEEDED FOR SLEEP., Disp: 90 tablet, Rfl: 0 .  valACYclovir (VALTREX) 1000 MG tablet, Take two tablets ( total 2000 mg) by mouth q12h x 1 day; Start: ASAP after symptom onset, Disp: 6 tablet, Rfl: 2 .  VITAMIN D PO, Take 50 mg by mouth daily., Disp: , Rfl:  .  dicyclomine (BENTYL) 10 MG capsule,  Take 1 capsule (10 mg total) by mouth 4 (four) times daily as needed for spasms., Disp: 60 capsule, Rfl: 0  No Known Allergies  Review of Systems   ROS Negative unless otherwise specified per HPI.  Vitals:   Vitals:   09/17/19 0843  BP: 110/64  Pulse: 68  Temp: 98.1 F (36.7 C)  TempSrc: Temporal  SpO2: 97%  Weight: 235 lb 6.1 oz (106.8 kg)  Height: 5\' 4"  (1.626 m)     Body mass index is 40.4 kg/m.   Physical Exam:    Physical Exam Vitals signs and nursing note reviewed.  Constitutional:      General: She is not in acute distress.    Appearance: She is well-developed. She is not ill-appearing or toxic-appearing.  HENT:     Head: Normocephalic and atraumatic.  Cardiovascular:     Rate and Rhythm: Normal rate and regular rhythm.     Heart sounds: Normal heart sounds.  Pulmonary:     Effort: Pulmonary effort is normal. No accessory muscle usage or respiratory distress.     Breath sounds: Normal breath sounds.  Abdominal:     General: Abdomen is flat. Bowel sounds are normal.     Palpations: Abdomen is soft.     Tenderness: There is generalized abdominal tenderness. There is no right CVA tenderness, left CVA tenderness, guarding or rebound.  Skin:    General: Skin is warm and dry.  Neurological:     Mental Status: She is alert.  Psychiatric:        Speech: Speech normal.        Behavior: Behavior is cooperative.      Assessment and Plan:    Kelsey Curtis was seen today for abdominal cramping.  Diagnoses and all orders for this visit:  Generalized abdominal pain; Constipation, unspecified constipation type Uncontrolled.  She is missing a lot of work due to this, so discussed need to send to GI to figure out what is going on.  I did give her a small prescription of Bentyl to trial.  I also gave her some IBgard samples as well as a FODMAP diet.  I recommend that she trial some of these but only do one at a time so we can see what is truly improving her symptoms.   Additionally I recommended that she increase her water intake and consider adding Benefiber to foods.  Worsening precautions advised. -     Ambulatory  referral to Gastroenterology  Other orders -     dicyclomine (BENTYL) 10 MG capsule; Take 1 capsule (10 mg total) by mouth 4 (four) times daily as needed for spasms.   . Reviewed expectations re: course of current medical issues. . Discussed self-management of symptoms. . Outlined signs and symptoms indicating need for more acute intervention. . Patient verbalized understanding and all questions were answered. . See orders for this visit as documented in the electronic medical record. . Patient received an After Visit Summary.  CMA or LPN served as scribe during this visit. History, Physical, and Plan performed by medical provider. The above documentation has been reviewed and is accurate and complete.  Jarold MottoSamantha Lamerle Jabs, PA-C [place], [place] 09/17/2019  Follow-up: No follow-ups on file.

## 2019-09-17 NOTE — Telephone Encounter (Signed)
Spoke to pt told her Kelsey Curtis said I am concerned with how much work she is missing. I thought at one point we had discussed doing FMLA? I will write the note this time, but she really needs to work on getting accommodations from work. Pt verbalized understanding and said she is was ineligible for FMLA till next year that is why the papers were not sent. Also lease note that Kelsey Curtis does not do any disability paperwork but I will do FMLA for her if needed. Told pt I will do letter and she can access it from her My Chart. Pt verbalized understanding.

## 2019-09-17 NOTE — Telephone Encounter (Signed)
See note  Copied from [place] [id number]. Topic: General - Other >> Sep 17, 2019  9:48 AM Lennox Solders wrote: Reason for CRM: pt was seen today and her job needs work note from 08-31-2019 until 09-20-2019. Pt will return to work on 09-21-2019. The work note needs to state patient was incapacitated and unable to complete assign duties

## 2019-09-22 ENCOUNTER — Encounter: Payer: Self-pay | Admitting: Gastroenterology

## 2019-09-22 ENCOUNTER — Ambulatory Visit (INDEPENDENT_AMBULATORY_CARE_PROVIDER_SITE_OTHER): Payer: 59 | Admitting: Gastroenterology

## 2019-09-22 VITALS — BP 110/70 | HR 98 | Temp 97.8°F | Ht 65.0 in | Wt 236.0 lb

## 2019-09-22 DIAGNOSIS — R194 Change in bowel habit: Secondary | ICD-10-CM

## 2019-09-22 DIAGNOSIS — K5900 Constipation, unspecified: Secondary | ICD-10-CM | POA: Diagnosis not present

## 2019-09-22 DIAGNOSIS — R1030 Lower abdominal pain, unspecified: Secondary | ICD-10-CM

## 2019-09-22 MED ORDER — TRULANCE 3 MG PO TABS: 1.0000 | ORAL_TABLET | Freq: Every day | ORAL | 11 refills | Status: DC

## 2019-09-22 NOTE — Progress Notes (Signed)
History of Present Illness: This is a 27 year old female referred by Inda Coke, PA for the evaluation of a change in bowel habits, lower abdominal pain, abdominal bloating and constipation.  Symptoms have been bothersome for a few months.  Prior to that she had mild intermittent constipation.  She has tried dietary changes, increasing water intake, Colace and MiraLAX with a modest improvement in symptoms.  Recently dicyclomine and IBgard were recommended by her PCP which helped reduce the pain.  Bowel movements are more frequent however she still has hard stools with straining and often has a sensation of incomplete fecal evacuation.  CBC, CMP, TSH in October were normal. Denies weight loss, diarrhea, change in stool caliber, melena, hematochezia, nausea, vomiting, dysphagia, reflux symptoms, chest pain.    No Known Allergies Outpatient Medications Prior to Visit  Medication Sig Dispense Refill  . APRI 0.15-30 MG-MCG tablet     . busPIRone (BUSPAR) 5 MG tablet Take 1 tablet (5 mg total) by mouth 3 (three) times daily as needed. 60 tablet 1  . dicyclomine (BENTYL) 10 MG capsule Take 1 capsule (10 mg total) by mouth 4 (four) times daily as needed for spasms. 60 capsule 0  . naproxen sodium (ALEVE) 220 MG tablet Take 440 mg by mouth as needed.    [name] Peppermint Oil (IBGARD PO) Take 1 capsule by mouth 2 (two) times daily.    . traZODone (DESYREL) 50 MG tablet TAKE 0.5-1 TABLETS (25-50 MG TOTAL) BY MOUTH AT BEDTIME AS NEEDED FOR SLEEP. 90 tablet 0  . valACYclovir (VALTREX) 1000 MG tablet Take two tablets ( total 2000 mg) by mouth q12h x 1 day; Start: ASAP after symptom onset 6 tablet 2  . VITAMIN D PO Take 50 mg by mouth daily.    . Wheat Dextrin (BENEFIBER DRINK MIX PO) Take 1 Dose by mouth daily.     No facility-administered medications prior to visit.    Past Medical History:  Diagnosis Date  . Pyelonephritis    History reviewed. No pertinent surgical history. Social History    Socioeconomic History  . Marital status: Single    Spouse name: Not on file  . Number of children: Not on file  . Years of education: Not on file  . Highest education level: Not on file  Occupational History  . Not on file  Social Needs  . Financial resource strain: Not on file  . Food insecurity    Worry: Not on file    Inability: Not on file  . Transportation needs    Medical: Not on file    Non-medical: Not on file  Tobacco Use  . Smoking status: Never Smoker  . Smokeless tobacco: Never Used  Substance and Sexual Activity  . Alcohol use: Yes    Comment: Occ.  . Drug use: No  . Sexual activity: Yes    Birth control/protection: Pill  Lifestyle  . Physical activity    Days per week: Not on file    Minutes per session: Not on file  . Stress: Not on file  Relationships  . Social Herbalist on phone: Not on file    Gets together: Not on file    Attends religious service: Not on file    Active member of club or organization: Not on file    Attends meetings of clubs or organizations: Not on file    Relationship status: Not on file  Other Topics Concern  . 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 Alzheimer's disease Maternal Grandmother   . Colon cancer Neg Hx   . Breast cancer Neg Hx        Review of Systems: Pertinent positive and negative review of systems were noted in the above HPI section. All other review of systems were otherwise negative.    Physical Exam: General: Well developed, well nourished, no acute distress Head: Normocephalic and atraumatic Eyes:  sclerae anicteric, EOMI Ears: Normal auditory acuity Mouth: No deformity or lesions Neck: Supple, no masses or thyromegaly Lungs: Clear throughout to auscultation  Heart: Regular rate and rhythm; no murmurs, rubs or bruits Abdomen: Soft, mild diffuse tenderness and non distended. No masses, hepatosplenomegaly or hernias noted. Normal Bowel sounds Rectal: Not done Musculoskeletal: Symmetrical with no gross deformities  Skin: No lesions on visible extremities Pulses:  Normal pulses noted Extremities: No clubbing, cyanosis, edema or deformities noted Neurological: Alert oriented x 4, grossly nonfocal Cervical Nodes:  No significant cervical adenopathy Inguinal Nodes: No significant inguinal adenopathy Psychological:  Alert and cooperative. Normal mood and affect   Assessment and Recommendations:  1. Constipation, lower abdominal pain, abdominal bloating, change in bowel habits.  Suspect all symptoms are related to constipation.  Begin Trulance 3 mg daily.  Hold dicyclomine and IBgard for now.  If constipation not adequately treated may add in MiraLAX once or twice daily titrated for a complete bowel movement daily.  I recommended colonoscopy for further evaluation and to exclude other disorders.  She will consider colonoscopy and discuss again at her follow-up visit.  Patient is advised to call in the interim if the above regimen is not adequate to control of her symptoms.  REV in 6 weeks.   2.  Family history of colon polyps, mother age 53. Colonoscopy at age 56.   cc: Jarold Motto, PA [address] [place],  [place] [address]

## 2019-09-22 NOTE — Patient Instructions (Signed)
Start samples of Trulance one tablet by mouth daily. A prescription has been sent to your pharmacy.   If you see no breakthrough in symptoms, then contact our office in a week.   Stop taking dicyclomine and Ibgard when you start the Trulance.   Thank you for choosing me and [facility].  Pricilla Riffle. Dagoberto Ligas., MD., [facility]al

## 2019-09-28 ENCOUNTER — Telehealth: Payer: Self-pay

## 2019-09-28 ENCOUNTER — Encounter: Payer: Self-pay | Admitting: Physician Assistant

## 2019-09-28 ENCOUNTER — Other Ambulatory Visit: Payer: Self-pay | Admitting: Gastroenterology

## 2019-09-28 NOTE — Telephone Encounter (Signed)
Linzess 145 mcg po qd, 2 months of refills Call if symptoms not controlled after 10-14 days on Linzess

## 2019-09-28 NOTE — Telephone Encounter (Signed)
Left a message for patient to return my call. 

## 2019-09-28 NOTE — Telephone Encounter (Signed)
Received faxed denial prior authorization for Trulance. Denial states the decision is based on health plan criteria for Trulance. This medicine is covered only if: You have failed or cannot take Linzess. Also that this medication may need PA as well.   Dr. Fuller Plan, can we send in Berkley for patient and if so, which dose?

## 2019-09-29 MED ORDER — LINACLOTIDE 145 MCG PO CAPS: 145.0000 ug | ORAL_CAPSULE | Freq: Every day | ORAL | 1 refills | Status: DC

## 2019-09-29 NOTE — Telephone Encounter (Signed)
Informed patient we are sending in Orick in the place of Trulance due to insurance but to contact our office in 10-14 days if constipation is not under control on Linzess. Patient states the Trulance gave her urgent loose stools anyway and is happy with the change. Prescription sent to the pharmacy.

## 2019-10-02 ENCOUNTER — Other Ambulatory Visit: Payer: Self-pay | Admitting: Physician Assistant

## 2019-11-03 ENCOUNTER — Other Ambulatory Visit: Payer: Self-pay

## 2019-11-03 ENCOUNTER — Encounter: Payer: Self-pay | Admitting: Gastroenterology

## 2019-11-03 ENCOUNTER — Ambulatory Visit (INDEPENDENT_AMBULATORY_CARE_PROVIDER_SITE_OTHER): Payer: 59 | Admitting: Gastroenterology

## 2019-11-03 VITALS — BP 104/70 | HR 84 | Temp 98.1°F | Ht 64.0 in | Wt 234.5 lb

## 2019-11-03 DIAGNOSIS — R1030 Lower abdominal pain, unspecified: Secondary | ICD-10-CM | POA: Diagnosis not present

## 2019-11-03 DIAGNOSIS — K5900 Constipation, unspecified: Secondary | ICD-10-CM

## 2019-11-03 MED ORDER — LINACLOTIDE 72 MCG PO CAPS: 72.0000 ug | ORAL_CAPSULE | Freq: Every day | ORAL | 11 refills | Status: DC

## 2019-11-03 NOTE — Patient Instructions (Signed)
Start the samples of Linzess 72 mcg daily. A prescription has been sent to your pharmacy as well.  Call our office back in 1-2 weeks if this medication is not helping.   Thank you for choosing me and [facility].  Pricilla Riffle. Dagoberto Ligas., MD., [facility] Regal

## 2019-11-03 NOTE — Progress Notes (Signed)
    History of Present Illness: This is a 28 year old female returning for follow-up of constipation, lower abdominal pain and abdominal bloating.  She relates problems with diarrhea since beginning Linzess 145 mcg. Her abdominal pain and bloating have essentially resolved. She no longer takes dicyclomine or IBgard.  Current Medications, Allergies, Past Medical History, Past Surgical History, Family History and Social History were reviewed in [employer] record.   Physical Exam: General: Well developed, well nourished, no acute distress Head: Normocephalic and atraumatic Eyes:  sclerae anicteric, EOMI Ears: Normal auditory acuity Mouth: No deformity or lesions Lungs: Clear throughout to auscultation Heart: Regular rate and rhythm; no murmurs, rubs or bruits Abdomen: Soft, non tender and non distended. No masses, hepatosplenomegaly or hernias noted. Normal Bowel sounds Rectal: Not done Musculoskeletal: Symmetrical with no gross deformities  Pulses:  Normal pulses noted Extremities: No clubbing, cyanosis, edema or deformities noted Neurological: Alert oriented x 4, grossly nonfocal Psychological:  Alert and cooperative. Normal mood and affect   Assessment and Recommendations:  1.  Constipation, lower abdominal pain, abdominal bloating. All symptoms resolved however Linzess is causing diarrhea side effects. Trulance 3 mg daily also cause diarrhea. Trial of Linzess 72 mcg daily. If this is not working well for her within the next 1 to 2 weeks she is advised to call for further advice. REV in 6 weeks.   2.  Family history of colon polyps, mother at age 35.  Recommended colonoscopy at age 32.

## 2019-11-10 ENCOUNTER — Other Ambulatory Visit: Payer: Self-pay | Admitting: Physician Assistant

## 2019-11-10 MED ORDER — TRAZODONE HCL 50 MG PO TABS: 50.0000 mg | ORAL_TABLET | Freq: Every evening | ORAL | 0 refills | Status: DC | PRN

## 2019-12-15 ENCOUNTER — Telehealth: Payer: Self-pay | Admitting: Physician Assistant

## 2019-12-15 NOTE — Telephone Encounter (Signed)
Please call patient to schedule virtual visit with me to discuss filling out her FMLA paperwork.  Jarold Motto PA-C

## 2019-12-16 ENCOUNTER — Telehealth: Payer: Self-pay | Admitting: *Deleted

## 2019-12-16 ENCOUNTER — Encounter: Payer: Self-pay | Admitting: Physician Assistant

## 2019-12-16 ENCOUNTER — Ambulatory Visit (INDEPENDENT_AMBULATORY_CARE_PROVIDER_SITE_OTHER): Payer: 59 | Admitting: Physician Assistant

## 2019-12-16 DIAGNOSIS — F419 Anxiety disorder, unspecified: Secondary | ICD-10-CM | POA: Diagnosis not present

## 2019-12-16 NOTE — Telephone Encounter (Signed)
FMLA paperwork completed by Lelon Mast, copy done to scan to chart, copy mailed to [facility] specialist and copy faxed to [facility] specialist, also copy put at the front for pt to pickup. Sending My Chart message to let pt know all has been done.

## 2019-12-16 NOTE — Progress Notes (Signed)
Virtual Visit via Video   I connected with Kelsey Curtis on 12/16/19 at 11:20 AM EST by a video enabled telemedicine application and verified that I am speaking with the correct person using two identifiers. Location patient: Home Location provider: [facility], Office Persons participating in the virtual visit: [place], Snod[place] PA-C, Corky Mull, LPN   I discussed the limitations of evaluation and management by telemedicine and the availability of in person appointments. The patient expressed understanding and agreed to proceed.  I acted as a Neurosurgeon for [employer], PA-C [employer], LPN  Subjective:   HPI:   Anxiety Pt wanting to discuss FMLA paperwork.  She is due for annual review and resubmission of her FMLA paperwork that she currently has for anxiety.  Her anxiety remains troublesome at times. She takes buspar prn, and does require trazodone most nights for her anxiety.  Overall, she feels like her FMLA accommodations have allowed her thrive better at work. She would like to continue her current FMLA accommodations.  Denies: SI/HI.   ROS: See pertinent positives and negatives per HPI.  Patient Active Problem List   Diagnosis Date Noted  . 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 Smoking status: Never Smoker  . Smokeless tobacco: Never Used  Substance Use Topics  . Alcohol use: Yes    Comment: Occ.    Current Outpatient Medications:  .  APRI 0.15-30 MG-MCG tablet, , Disp: , Rfl:  .  linaclotide (LINZESS) 72 MCG capsule, Take 1 capsule (72 mcg total) by mouth daily before breakfast., Disp: 30 capsule, Rfl: 11 .  naproxen sodium (ALEVE) 220 MG tablet, Take 440 mg by mouth as needed., Disp: , Rfl:  .  Peppermint Oil (IBGARD PO), Take 1 capsule by mouth 2 (two) times daily., Disp: , Rfl:  .  traZODone (DESYREL) 50 MG tablet, Take 1 tablet (50 mg total) by mouth at bedtime as needed for sleep., Disp: 90 tablet, Rfl:  0 .  valACYclovir (VALTREX) 1000 MG tablet, Take two tablets ( total 2000 mg) by mouth q12h x 1 day; Start: ASAP after symptom onset, Disp: 6 tablet, Rfl: 2 .  VITAMIN D PO, Take 50 mg by mouth daily., Disp: , Rfl:  .  Wheat Dextrin (BENEFIBER DRINK MIX PO), Take 1 Dose by mouth daily., Disp: , Rfl:   No Known Allergies  Objective:   VITALS: Per patient if applicable, see vitals. GENERAL: Alert, appears well and in no acute distress. HEENT: Atraumatic, conjunctiva clear, no obvious abnormalities on inspection of external nose and ears. NECK: Normal movements of the head and neck. CARDIOPULMONARY: No increased WOB. Speaking in clear sentences. I:E ratio WNL.  MS: Moves all visible extremities without noticeable abnormality. PSYCH: Pleasant and cooperative, well-groomed. Speech normal rate and rhythm. Affect is appropriate. Insight and judgement are appropriate. Attention is focused, linear, and appropriate.  NEURO: CN grossly intact. Oriented as arrived to appointment on time with no prompting. Moves both UE equally.  SKIN: No obvious lesions, wounds, erythema, or cyanosis noted on face or hands.  Assessment and Plan:   Kelsey Curtis was seen today for discuss fmla and anxiety.  Diagnoses and all orders for this visit:  Anxiety   Stable. Continue current regimen of Buspar prn and Trazodone. FMLA paperwork completed for patient for 1 year. Follow-up in 6 months, sooner if concerns.  . Reviewed expectations re: course of current medical issues. . Discussed self-management of symptoms. . Outlined signs  and symptoms indicating need for more acute intervention. . Patient verbalized understanding and all questions were answered. [name] Health Maintenance issues including appropriate healthy diet, exercise, and smoking avoidance were discussed with patient. . See orders for this visit as documented in the electronic medical record.  I discussed the assessment and treatment plan with the patient.  The patient was provided an opportunity to ask questions and all were answered. The patient agreed with the plan and demonstrated an understanding of the instructions.   The patient was advised to call back or seek an in-person evaluation if the symptoms worsen or if the condition fails to improve as anticipated.   CMA or LPN served as scribe during this visit. History, Physical, and Plan performed by medical provider. The above documentation has been reviewed and is accurate and complete.   [place] , [place] 12/16/2019

## 2019-12-18 ENCOUNTER — Ambulatory Visit: Payer: 59 | Admitting: Gastroenterology

## 2020-01-11 ENCOUNTER — Ambulatory Visit: Payer: 59 | Admitting: Gastroenterology

## 2020-02-11 ENCOUNTER — Ambulatory Visit: Payer: 59

## 2020-02-11 ENCOUNTER — Ambulatory Visit: Payer: 59 | Attending: Internal Medicine

## 2020-02-11 DIAGNOSIS — Z23 Encounter for immunization: Secondary | ICD-10-CM

## 2020-02-11 NOTE — Progress Notes (Signed)
   Covid-19 Vaccination Clinic  Name:  Rasha Ibe    MRN: [id number] DOB: 1991-10-30  02/11/2020  Ms. Vassey was observed post Covid-19 immunization for 15 minutes without incident. She was provided with Vaccine Information Sheet and instruction to access the V-Safe system.   Ms. Boehning was instructed to call 911 with any severe reactions post vaccine: [name] Difficulty breathing  . Swelling of face and throat  . A fast heartbeat  . A bad rash all over body  . Dizziness and weakness   Immunizations Administered    Name Date Dose VIS Date Route   Pfizer COVID-19 Vaccine 02/11/2020  3:30 PM 0.3 mL 10/30/2019 Intramuscular   Manufacturer: [employer], [employer]   Lot: [id number]   NDC: [address]-8

## 2020-03-07 ENCOUNTER — Ambulatory Visit: Payer: 59 | Attending: Internal Medicine

## 2020-03-07 DIAGNOSIS — Z23 Encounter for immunization: Secondary | ICD-10-CM

## 2020-03-07 NOTE — Progress Notes (Signed)
   Covid-19 Vaccination Clinic  Name:  Kelsey Curtis    MRN: [id number] DOB: 06-17-1991  03/07/2020  [name] was observed post Covid-19 immunization for 15 minutes without incident. She was provided with Vaccine Information Sheet and instruction to access the V-Safe system.   [name] was instructed to call 911 with any severe reactions post vaccine: [name] Difficulty breathing  . Swelling of face and throat  . A fast heartbeat  . A bad rash all over body  . Dizziness and weakness   Immunizations Administered    Name Date Dose VIS Date Route   Pfizer COVID-19 Vaccine 03/07/2020  1:09 PM 0.3 mL 01/13/2019 Intramuscular   Manufacturer: [employer], [employer]   Lot: [id number]   NDC: [address]-2

## 2020-03-08 ENCOUNTER — Telehealth: Payer: Self-pay | Admitting: Physician Assistant

## 2020-03-08 NOTE — Telephone Encounter (Signed)
Spoke to pt, she said she had her 2nd COVID shot having headache, sore arm but has been exposed at work, co-worker tested positive. Told her to monitor symptoms if continue past 48 hours go get tested for COVID. Website given to pt to make appt. Pt verbalized understanding.

## 2020-03-08 NOTE — Telephone Encounter (Signed)
Pt called and spoke with [facility]. Pt stated she received the 2nd dose of the COVID vaccine last Thursday. She received a call from her employer on Friday notifying her that she had been exposed to someone who tested positive for COVID at her work. Pt is wondering what she should do since she had the COVID vaccine but was exposed after. Please advise.

## 2020-03-09 ENCOUNTER — Telehealth (INDEPENDENT_AMBULATORY_CARE_PROVIDER_SITE_OTHER): Payer: 59 | Admitting: Physician Assistant

## 2020-03-09 ENCOUNTER — Other Ambulatory Visit: Payer: Self-pay

## 2020-03-09 ENCOUNTER — Telehealth: Payer: Self-pay

## 2020-03-09 ENCOUNTER — Encounter: Payer: Self-pay | Admitting: Physician Assistant

## 2020-03-09 VITALS — Temp 98.0°F | Ht 64.0 in | Wt 230.0 lb

## 2020-03-09 DIAGNOSIS — R6889 Other general symptoms and signs: Secondary | ICD-10-CM | POA: Diagnosis not present

## 2020-03-09 NOTE — Telephone Encounter (Signed)
Patient states that she's not feeling any better. Patient was able to schedule covid test but patient can't get tested until 03/14/20.  Patient would like a doctor's note for work.

## 2020-03-09 NOTE — Progress Notes (Signed)
Virtual Visit via Video   I connected with Kelsey Curtis on 03/09/20 at 11:30 AM EDT by a video enabled telemedicine application and verified that I am speaking with the correct person using two identifiers. Location patient: Home Location provider: [facility], Office Persons participating in the virtual visit: [place], Riehl PA-C,Donna Lennon Alstrom, LPN   I discussed the limitations of evaluation and management by telemedicine and the availability of in person appointments. The patient expressed understanding and agreed to proceed.  I acted as a Neurosurgeon for [employer], PA-C [employer], LPN  Subjective:   HPI:   Patient is requesting evaluation for possible COVID-19.  Symptom onset: Late Monday night. Pt had second COVID vaccine on Monday 04/19.  Travel/contacts: No travel, positive exposure at work. Found out co-worker positive on Monday.  Patient endorses the following symptoms: Fever (100), sinus headache, sinus congestion, dry cough (non-productive) and myalgia, Pt has been in bed for 2 days. Fever last night.  Patient denies the following symptoms: sinus pain, rhinorrhea, ear pain, sore throat, wheezing, shortness of breath, chest tightness and chest pain  Treatments tried: Tylenol  Patient risk factors: Current COVID-19 risk of complications score: 1 Smoking status: Kelsey Curtis  reports that she has never smoked. She has never used smokeless tobacco. If female, currently pregnant? []   Yes [x]   No  ROS: See pertinent positives and negatives per HPI.  Patient Active Problem List   Diagnosis Date Noted  . 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 Smoking status: Never Smoker  . Smokeless tobacco: Never Used  Substance Use Topics  . Alcohol use: Yes    Comment: Occ.    Current Outpatient Medications:  .  APRI 0.15-30 MG-MCG tablet, , Disp: , Rfl:  .  busPIRone (BUSPAR) 5 MG tablet, Take 5 mg by mouth 3 (three)  times daily as needed., Disp: , Rfl:  .  naproxen sodium (ALEVE) 220 MG tablet, Take 440 mg by mouth as needed., Disp: , Rfl:  .  traZODone (DESYREL) 50 MG tablet, Take 1 tablet (50 mg total) by mouth at bedtime as needed for sleep., Disp: 90 tablet, Rfl: 0 .  valACYclovir (VALTREX) 1000 MG tablet, Take two tablets ( total 2000 mg) by mouth q12h x 1 day; Start: ASAP after symptom onset, Disp: 6 tablet, Rfl: 2 .  VITAMIN D PO, Take 50 mg by mouth daily., Disp: , Rfl:   No Known Allergies  Objective:   VITALS: Per patient if applicable, see vitals. GENERAL: Alert, appears well and in no acute distress. HEENT: Atraumatic, conjunctiva clear, no obvious abnormalities on inspection of external nose and ears. NECK: Normal movements of the head and neck. CARDIOPULMONARY: No increased WOB. Speaking in clear sentences. I:E ratio WNL.  MS: Moves all visible extremities without noticeable abnormality. PSYCH: Pleasant and cooperative, well-groomed. Speech normal rate and rhythm. Affect is appropriate. Insight and judgement are appropriate. Attention is focused, linear, and appropriate.  NEURO: CN grossly intact. Oriented as arrived to appointment on time with no prompting. Moves both UE equally.  SKIN: No obvious lesions, wounds, erythema, or cyanosis noted on face or hands.  Assessment and Plan:   Aveen was seen today for covid symptoms.  Diagnoses and all orders for this visit:  Flu-like symptoms   Patient has a respiratory illness without signs of acute distress or respiratory compromise at this time. This is likely a viral infection, which can come from a number of  respiratory viruses.  I suspect that patient is having symptoms from her second Covid vaccine, however due to her COVID-19 exposure, we are going to send patient for COVID-19 testing. As a precaution, they have been advised to remain home until COVID-19 results and then possible further quarantine after that based on results and  symptoms. Advised if they experience a "second sickening" or worsening symptoms as the illness progresses, they are to call the office for further instructions or seek emergent evaluation for any severe symptoms.   . Reviewed expectations re: course of current medical issues. . Discussed self-management of symptoms. . Outlined signs and symptoms indicating need for more acute intervention. . Patient verbalized understanding and all questions were answered. [name] Health Maintenance issues including appropriate healthy diet, exercise, and smoking avoidance were discussed with patient. . See orders for this visit as documented in the electronic medical record.  I discussed the assessment and treatment plan with the patient. The patient was provided an opportunity to ask questions and all were answered. The patient agreed with the plan and demonstrated an understanding of the instructions.   The patient was advised to call back or seek an in-person evaluation if the symptoms worsen or if the condition fails to improve as anticipated.   CMA or LPN served as scribe during this visit. History, Physical, and Plan performed by medical provider. The above documentation has been reviewed and is accurate and complete.  [place], [place] 03/09/2020

## 2020-03-09 NOTE — Telephone Encounter (Signed)
Please call pt and schedule virtual visit. Will need visit in order to give note for work.

## 2020-03-09 NOTE — Telephone Encounter (Signed)
Patient has been scheduled for a Mychart Visit.

## 2020-03-09 NOTE — Telephone Encounter (Signed)
Error

## 2020-03-14 ENCOUNTER — Ambulatory Visit: Payer: 59 | Attending: Internal Medicine

## 2020-03-14 DIAGNOSIS — Z20822 Contact with and (suspected) exposure to covid-19: Secondary | ICD-10-CM

## 2020-03-15 LAB — NOVEL CORONAVIRUS, NAA: SARS-CoV-2, NAA: NOT DETECTED

## 2020-03-15 LAB — SARS-COV-2, NAA 2 DAY TAT

## 2020-06-20 ENCOUNTER — Telehealth: Payer: Self-pay

## 2020-06-20 ENCOUNTER — Encounter: Payer: Self-pay | Admitting: Physician Assistant

## 2020-06-20 ENCOUNTER — Ambulatory Visit: Payer: 59 | Admitting: Physician Assistant

## 2020-06-20 ENCOUNTER — Other Ambulatory Visit: Payer: Self-pay

## 2020-06-20 ENCOUNTER — Telehealth (INDEPENDENT_AMBULATORY_CARE_PROVIDER_SITE_OTHER): Payer: 59 | Admitting: Physician Assistant

## 2020-06-20 VITALS — Ht 64.0 in | Wt 236.0 lb

## 2020-06-20 DIAGNOSIS — J069 Acute upper respiratory infection, unspecified: Secondary | ICD-10-CM | POA: Diagnosis not present

## 2020-06-20 DIAGNOSIS — R399 Unspecified symptoms and signs involving the genitourinary system: Secondary | ICD-10-CM

## 2020-06-20 MED ORDER — NITROFURANTOIN MONOHYD MACRO 100 MG PO CAPS: 100.0000 mg | ORAL_CAPSULE | Freq: Two times a day (BID) | ORAL | 0 refills | Status: DC

## 2020-06-20 NOTE — Progress Notes (Signed)
Virtual Visit via Video   I connected with Kelsey Curtis on 06/20/20 at  2:00 PM EDT by a video enabled telemedicine application and verified that I am speaking with the correct person using two identifiers. Location patient: Home Location provider:  [facility], Office Persons participating in the virtual visit: Kelsey Curtis, [place] PA-C, Corky Mull, LPN   I discussed the limitations of evaluation and management by telemedicine and the availability of in person appointments. The patient expressed understanding and agreed to proceed.  I acted as a Neurosurgeon for [employer], [employer], LPN   Subjective:   HPI:   Dysuria Pt c/o some pain with urination and strong odor x 5-6 days. Denies back pain. She feels like she is not emptying her bladder all the way. No vaginal discharge.  Denies concern for pregnancy, hematuria.  Patient's last menstrual period was 06/16/2020.  Viral URI She is fully COVID vaccinated.  She was exposed to COVID-19 last week and was tested yesterday.  She is still awaiting her test results. Having some chills and low grade fever. Headache x 4 days.  Having some general achiness as well.  ROS: See pertinent positives and negatives per HPI.  Patient Active Problem List   Diagnosis Date Noted  . 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 Smoking status: Never Smoker  . Smokeless tobacco: Never Used  Substance Use Topics  . Alcohol use: Yes    Comment: Occ.    Current Outpatient Medications:  .  APRI 0.15-30 MG-MCG tablet, , Disp: , Rfl:  .  busPIRone (BUSPAR) 5 MG tablet, Take 5 mg by mouth 3 (three) times daily as needed., Disp: , Rfl:  .  naproxen sodium (ALEVE) 220 MG tablet, Take 440 mg by mouth as needed., Disp: , Rfl:  .  traZODone (DESYREL) 50 MG tablet, Take 1 tablet (50 mg total) by mouth at bedtime as needed for sleep., Disp: 90 tablet, Rfl: 0 .  valACYclovir (VALTREX) 1000 MG tablet, Take two  tablets ( total 2000 mg) by mouth q12h x 1 day; Start: ASAP after symptom onset, Disp: 6 tablet, Rfl: 2 .  VITAMIN D PO, Take 50 mg by mouth daily., Disp: , Rfl:  .  nitrofurantoin, macrocrystal-monohydrate, (MACROBID) 100 MG capsule, Take 1 capsule (100 mg total) by mouth 2 (two) times daily., Disp: 10 capsule, Rfl: 0  No Known Allergies  Objective:   VITALS: Per patient if applicable, see vitals. GENERAL: Alert, appears well and in no acute distress. HEENT: Atraumatic, conjunctiva clear, no obvious abnormalities on inspection of external nose and ears. NECK: Normal movements of the head and neck. CARDIOPULMONARY: No increased WOB. Speaking in clear sentences. I:E ratio WNL.  MS: Moves all visible extremities without noticeable abnormality. PSYCH: Pleasant and cooperative, well-groomed. Speech normal rate and rhythm. Affect is appropriate. Insight and judgement are appropriate. Attention is focused, linear, and appropriate.  NEURO: CN grossly intact. Oriented as arrived to appointment on time with no prompting. Moves both UE equally.  SKIN: No obvious lesions, wounds, erythema, or cyanosis noted on face or hands.  Assessment and Plan:   Fayrene was seen today for odor in urine and dysuria.  Diagnoses and all orders for this visit:  Viral URI Patient has a respiratory illness without signs of acute distress or respiratory compromise at this time. This is likely a viral infection, which can come from a number of respiratory viruses.  She is currently awaiting her  Covid test.  As a precaution, they have been advised to remain home until COVID-19 results and then possible further quarantine after that based on results and symptoms. Advised if they experience a "second sickening" or worsening symptoms as the illness progresses, they are to call the office for further instructions or seek emergent evaluation for any severe symptoms.   Urinary tract infection symptoms Will treat with oral  Macrobid per orders.  Did discuss that due to virtual nature of visit I am unable to accurately determine best antibiotic for her if we do not send off a urine culture.  Patient verbalized understanding and plan.  Recommend follow-up if no improvement in urinary symptoms in 1 to 2 days.  Worsening precautions advised.  Other orders -     nitrofurantoin, macrocrystal-monohydrate, (MACROBID) 100 MG capsule; Take 1 capsule (100 mg total) by mouth 2 (two) times daily.  . Reviewed expectations re: course of current medical issues. . Discussed self-management of symptoms. . Outlined signs and symptoms indicating need for more acute intervention. . Patient verbalized understanding and all questions were answered. [name] Health Maintenance issues including appropriate healthy diet, exercise, and smoking avoidance were discussed with patient. . See orders for this visit as documented in the electronic medical record.  I discussed the assessment and treatment plan with the patient. The patient was provided an opportunity to ask questions and all were answered. The patient agreed with the plan and demonstrated an understanding of the instructions.   The patient was advised to call back or seek an in-person evaluation if the symptoms worsen or if the condition fails to improve as anticipated.   CMA or LPN served as scribe during this visit. History, Physical, and Plan performed by medical provider. The above documentation has been reviewed and is accurate and complete.   [place], [place] 06/20/2020

## 2020-06-20 NOTE — Telephone Encounter (Signed)
Please see message and advise. Do you want to see pt virtual?

## 2020-06-20 NOTE — Telephone Encounter (Signed)
Pt was around a coworker last week that ended up being covid positive. She was around her Monday- Wednesday and found out coworker was positive on Wednesday. She went through a drive thru covid test site yesterday and tested negative. 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 Pt was also supposed to be seen this morning for a UTI, but had to cancel because of the exposure. She said she still feels the need to urinate after urinating. She also states the urine smells like ammonia.

## 2020-06-20 NOTE — Telephone Encounter (Signed)
Please see Samantha's message and schedule virtual.

## 2020-06-20 NOTE — Telephone Encounter (Signed)
Can we put patient in my 2p slot? VIRTUAL  If she can have someone swing by for a urine cup before hand that would be great.

## 2020-06-20 NOTE — Telephone Encounter (Signed)
Patient has been scheduled

## 2020-06-21 ENCOUNTER — Encounter: Payer: Self-pay | Admitting: Physician Assistant

## 2020-07-04 ENCOUNTER — Ambulatory Visit: Payer: 59 | Admitting: Physician Assistant

## 2020-07-05 ENCOUNTER — Ambulatory Visit: Payer: 59 | Admitting: Physician Assistant

## 2020-07-08 ENCOUNTER — Ambulatory Visit (INDEPENDENT_AMBULATORY_CARE_PROVIDER_SITE_OTHER): Payer: 59 | Admitting: Physician Assistant

## 2020-07-08 ENCOUNTER — Other Ambulatory Visit: Payer: Self-pay

## 2020-07-08 ENCOUNTER — Encounter: Payer: Self-pay | Admitting: Physician Assistant

## 2020-07-08 VITALS — BP 110/60 | HR 70 | Temp 97.6°F | Ht 64.0 in | Wt 232.0 lb

## 2020-07-08 DIAGNOSIS — R351 Nocturia: Secondary | ICD-10-CM

## 2020-07-08 DIAGNOSIS — F5101 Primary insomnia: Secondary | ICD-10-CM

## 2020-07-08 LAB — POCT URINALYSIS DIPSTICK
Bilirubin, UA: NEGATIVE
Blood, UA: NEGATIVE
Glucose, UA: NEGATIVE
Ketones, UA: NEGATIVE
Nitrite, UA: NEGATIVE
Protein, UA: NEGATIVE
Spec Grav, UA: 1.015 (ref 1.010–1.025)
Urobilinogen, UA: 0.2 E.U./dL
pH, UA: 6.5 (ref 5.0–8.0)

## 2020-07-08 MED ORDER — TRAZODONE HCL 50 MG PO TABS: 50.0000 mg | ORAL_TABLET | Freq: Every evening | ORAL | 0 refills | Status: DC | PRN

## 2020-07-08 NOTE — Patient Instructions (Addendum)
It was great to see you!  Keflex antibiotic will be sent in for you. Start this over the weekend if you feel worse.  I will be in touch with your lab results and urine culture.  Take care,  Jarold Motto PA-C

## 2020-07-08 NOTE — Progress Notes (Signed)
Kelsey Curtis is a 29 y.o. female here for a follow up of a pre-existing problem.  I acted as a Neurosurgeon for [employer], PA-C Kelsey Mull, LPN   History of Present Illness:   Chief Complaint  Patient presents with  . Nocturia    HPI   Nocturia Pt c/o nocturia x 3 for several weeks. Denies dysuria, hematuria, vaginal discharge. Pt was treated on 8/2 for Viral URI and UTI symptoms virtually and was given macrobid and took this completely. Due to virtual nature of visit she was unable to provide a urine sample/culture. She is having episodes of urination up to 3-4 times per night at times. Denies constipation. Only other symptom she reports is occasional sweating that seems to be unprovoked. She endorses significant stress with her job. Denies unusual weight changes, polyphagia/polydipsia.  Patient's last menstrual period was 06/16/2020.  Insomnia Doing well with trazodone 50 mg daily. Tolerating well and would like refill today. Denies new concerns.  Past Medical History:  Diagnosis Date  . Pyelonephritis      Social History   Tobacco Use  . Smoking status: Never Smoker  . Smokeless tobacco: Never Used  Substance Use Topics  . Alcohol use: Yes    Comment: Occ.  . Drug use: No    Past Surgical History:  Procedure Laterality Date  . NO PAST SURGERIES      Family History  Problem Relation Age of Onset  . Alzheimer's disease Maternal Grandmother   . Colon cancer Neg Hx   . Breast cancer Neg Hx     No Known Allergies  Current Medications:   Current Outpatient Medications:  .  APRI 0.15-30 MG-MCG tablet, , Disp: , Rfl:  .  busPIRone (BUSPAR) 5 MG tablet, Take 5 mg by mouth 3 (three) times daily as needed., Disp: , Rfl:  .  naproxen sodium (ALEVE) 220 MG tablet, Take 440 mg by mouth as needed., Disp: , Rfl:  .  traZODone (DESYREL) 50 MG tablet, Take 1 tablet (50 mg total) by mouth at bedtime as needed for sleep., Disp: 90 tablet, Rfl: 0 .  valACYclovir  (VALTREX) 1000 MG tablet, Take two tablets ( total 2000 mg) by mouth q12h x 1 day; Start: ASAP after symptom onset, Disp: 6 tablet, Rfl: 2 .  VITAMIN D PO, Take 50 mg by mouth daily., Disp: , Rfl:    Review of Systems:   ROS  Negative unless otherwise specified per HPI.  Vitals:   Vitals:   07/08/20 1017  BP: 110/60  Pulse: 70  Temp: 97.6 F (36.4 C)  TempSrc: Temporal  SpO2: 98%  Weight: 232 lb (105.2 kg)  Height: 5\' 4"  (1.626 m)     Body mass index is 39.82 kg/m.  Physical Exam:   Physical Exam Vitals and nursing note reviewed.  Constitutional:      General: She is not in acute distress.    Appearance: She is well-developed. She is not ill-appearing or toxic-appearing.  Cardiovascular:     Rate and Rhythm: Normal rate and regular rhythm.     Pulses: Normal pulses.     Heart sounds: Normal heart sounds, S1 normal and S2 normal.     Comments: No LE edema Pulmonary:     Effort: Pulmonary effort is normal.     Breath sounds: Normal breath sounds.  Skin:    General: Skin is warm and dry.  Neurological:     Mental Status: She is alert.     GCS: GCS  eye subscore is 4. GCS verbal subscore is 5. GCS motor subscore is 6.  Psychiatric:        Speech: Speech normal.        Behavior: Behavior normal. Behavior is cooperative.     Results for orders placed or performed in visit on 07/08/20  POCT urinalysis dipstick  Result Value Ref Range   Color, UA amber    Clarity, UA clear    Glucose, UA Negative Negative   Bilirubin, UA Negative    Ketones, UA Negative    Spec Grav, UA 1.015 1.010 - 1.025   Blood, UA Negative    pH, UA 6.5 5.0 - 8.0   Protein, UA Negative Negative   Urobilinogen, UA 0.2 0.2 or 1.0 E.U./dL   Nitrite, UA Negative    Leukocytes, UA Small (1+) (A) Negative   Appearance     Odor      Assessment and Plan:   Kelsey Curtis was seen today for nocturia.  Diagnoses and all orders for this visit:  Nocturia Unclear etiology. Does have small leuks  on UA today. Culture pending. She has had ongoing symptoms for quite some time, so we decided to send in keflex should symptoms worsen over the weekend. Will also check labs to further work-up symptoms. If no obvious explanation, will refer to urology. -     POCT urinalysis dipstick -     Urine Culture; Future -     Urine Culture -     CBC with Differential/Platelet; Future -     Comprehensive metabolic panel; Future -     TSH; Future -     TSH -     Comprehensive metabolic panel -     CBC with Differential/Platelet  Insomnia Stable.  Refill trazodone today.  Other orders -     traZODone (DESYREL) 50 MG tablet; Take 1 tablet (50 mg total) by mouth at bedtime as needed for sleep.  . Reviewed expectations re: course of current medical issues. . Discussed self-management of symptoms. . Outlined signs and symptoms indicating need for more acute intervention. . Patient verbalized understanding and all questions were answered. . See orders for this visit as documented in the electronic medical record. . Patient received an After-Visit Summary.  CMA or LPN served as scribe during this visit. History, Physical, and Plan performed by medical provider. The above documentation has been reviewed and is accurate and complete.   Kelsey Motto, PA-C

## 2020-07-09 LAB — CBC WITH DIFFERENTIAL/PLATELET
Absolute Monocytes: 578 cells/uL (ref 200–950)
Basophils Absolute: 23 cells/uL (ref 0–200)
Basophils Relative: 0.3 %
Eosinophils Absolute: 69 cells/uL (ref 15–500)
Eosinophils Relative: 0.9 %
HCT: 40.9 % (ref 35.0–45.0)
Hemoglobin: 13.4 g/dL (ref 11.7–15.5)
Lymphs Abs: 2649 cells/uL (ref 850–3900)
MCH: 28.1 pg (ref 27.0–33.0)
MCHC: 32.8 g/dL (ref 32.0–36.0)
MCV: 85.7 fL (ref 80.0–100.0)
MPV: 9.2 fL (ref 7.5–12.5)
Monocytes Relative: 7.5 %
Neutro Abs: 4381 cells/uL (ref 1500–7800)
Neutrophils Relative %: 56.9 %
Platelets: 298 10*3/uL (ref 140–400)
RBC: 4.77 10*6/uL (ref 3.80–5.10)
RDW: 12.2 % (ref 11.0–15.0)
Total Lymphocyte: 34.4 %
WBC: 7.7 10*3/uL (ref 3.8–10.8)

## 2020-07-09 LAB — COMPREHENSIVE METABOLIC PANEL
AG Ratio: 1.3 (calc) (ref 1.0–2.5)
ALT: 15 U/L (ref 6–29)
AST: 14 U/L (ref 10–30)
Albumin: 3.9 g/dL (ref 3.6–5.1)
Alkaline phosphatase (APISO): 69 U/L (ref 31–125)
BUN: 11 mg/dL (ref 7–25)
CO2: 25 mmol/L (ref 20–32)
Calcium: 8.8 mg/dL (ref 8.6–10.2)
Chloride: 104 mmol/L (ref 98–110)
Creat: 0.94 mg/dL (ref 0.50–1.10)
Globulin: 3.1 g/dL (calc) (ref 1.9–3.7)
Glucose, Bld: 80 mg/dL (ref 65–99)
Potassium: 4.3 mmol/L (ref 3.5–5.3)
Sodium: 138 mmol/L (ref 135–146)
Total Bilirubin: 0.5 mg/dL (ref 0.2–1.2)
Total Protein: 7 g/dL (ref 6.1–8.1)

## 2020-07-09 LAB — TSH: TSH: 0.66 mIU/L

## 2020-07-10 LAB — URINE CULTURE
MICRO NUMBER:: 10852790
SPECIMEN QUALITY:: ADEQUATE

## 2020-07-11 ENCOUNTER — Other Ambulatory Visit: Payer: Self-pay | Admitting: Physician Assistant

## 2020-07-11 ENCOUNTER — Telehealth: Payer: Self-pay | Admitting: Physician Assistant

## 2020-07-11 MED ORDER — AMOXICILLIN-POT CLAVULANATE 875-125 MG PO TABS: 1.0000 | ORAL_TABLET | Freq: Two times a day (BID) | ORAL | 0 refills | Status: AC

## 2020-07-11 NOTE — Telephone Encounter (Signed)
Left message on voicemail to call office.  

## 2020-07-11 NOTE — Telephone Encounter (Signed)
Pt called back. I gave her Sam's message below. She verbalized understanding

## 2020-07-11 NOTE — Telephone Encounter (Signed)
Noted  

## 2020-07-11 NOTE — Telephone Encounter (Signed)
Patient stated when she came in Friday Kelsey Curtis was going to send in Keflex for her patient stated it was never sent in.

## 2020-07-11 NOTE — Telephone Encounter (Signed)
I apologize for the inconvenience.  I have just sent in Augmentin -- I think this will be more effective antibiotic. Please start this today.

## 2020-07-11 NOTE — Telephone Encounter (Signed)
Please see message. °

## 2020-08-24 ENCOUNTER — Ambulatory Visit (INDEPENDENT_AMBULATORY_CARE_PROVIDER_SITE_OTHER): Payer: 59 | Admitting: Physician Assistant

## 2020-08-24 ENCOUNTER — Encounter: Payer: Self-pay | Admitting: Physician Assistant

## 2020-08-24 ENCOUNTER — Other Ambulatory Visit: Payer: Self-pay

## 2020-08-24 ENCOUNTER — Ambulatory Visit (INDEPENDENT_AMBULATORY_CARE_PROVIDER_SITE_OTHER)
Admission: RE | Admit: 2020-08-24 | Discharge: 2020-08-24 | Disposition: A | Discharge status: Home / Self Care | Payer: 59 | Source: Ambulatory Visit | Attending: Physician Assistant | Admitting: Physician Assistant

## 2020-08-24 ENCOUNTER — Encounter: Payer: Self-pay | Admitting: Family Medicine

## 2020-08-24 VITALS — BP 120/66 | HR 85 | Temp 97.6°F | Ht 64.0 in | Wt 234.2 lb

## 2020-08-24 DIAGNOSIS — M79671 Pain in right foot: Secondary | ICD-10-CM | POA: Diagnosis not present

## 2020-08-24 DIAGNOSIS — Z23 Encounter for immunization: Secondary | ICD-10-CM | POA: Diagnosis not present

## 2020-08-24 MED ORDER — DICLOFENAC SODIUM 75 MG PO TBEC: 75.0000 mg | DELAYED_RELEASE_TABLET | Freq: Two times a day (BID) | ORAL | 0 refills | Status: DC

## 2020-08-24 NOTE — Patient Instructions (Signed)
It was great to see you!  Continue rest and elevation. Trial oral diclofenac, this will replace ibuprofen.  An order for an xray has been put in for you. To get your xray, you can walk in at the [facility] location without a scheduled appointment.  The address is [address] It is across the street from [facility]. X-ray is located in the basement.  Hours of operation are M-F 8:30am to 5:00pm. Please note that they are closed for lunch between 12:30 and 1:00pm.  Let's follow-up for a physical at your convenience, sooner if you have concerns.  Take care,  Jarold Motto PA-C

## 2020-08-24 NOTE — Progress Notes (Signed)
Kelsey Curtis is a 29 y.o. female here for a new problem.  I acted as a Neurosurgeon for [employer], PA-C Corky Mull, LPN   History of Present Illness:   Chief Complaint  Patient presents with  . Foot Pain    HPI   Foot pain Pt c/o right foot pain, started a few weeks ago, pain is at top of foot. Does not radiate. Area is swollen at times. Has been using heat/ice and aleve with no relief. Does not recall any injury. Area is not warm to touch. Denies numbness and tingling or weakness.    Past Medical History:  Diagnosis Date  . Pyelonephritis      Social History   Tobacco Use  . Smoking status: Never Smoker  . Smokeless tobacco: Never Used  Substance Use Topics  . Alcohol use: Yes    Comment: Occ.  . Drug use: No    Past Surgical History:  Procedure Laterality Date  . NO PAST SURGERIES      Family History  Problem Relation Age of Onset  . Alzheimer's disease Maternal Grandmother   . Colon cancer Neg Hx   . Breast cancer Neg Hx     No Known Allergies  Current Medications:   Current Outpatient Medications:  .  APRI 0.15-30 MG-MCG tablet, , Disp: , Rfl:  .  busPIRone (BUSPAR) 5 MG tablet, Take 5 mg by mouth 3 (three) times daily as needed., Disp: , Rfl:  .  naproxen sodium (ALEVE) 220 MG tablet, Take 440 mg by mouth as needed., Disp: , Rfl:  .  traZODone (DESYREL) 50 MG tablet, Take 1 tablet (50 mg total) by mouth at bedtime as needed for sleep., Disp: 90 tablet, Rfl: 0 .  valACYclovir (VALTREX) 1000 MG tablet, Take two tablets ( total 2000 mg) by mouth q12h x 1 day; Start: ASAP after symptom onset, Disp: 6 tablet, Rfl: 2 .  VITAMIN D PO, Take 50 mg by mouth daily., Disp: , Rfl:  .  diclofenac (VOLTAREN) 75 MG EC tablet, Take 1 tablet (75 mg total) by mouth 2 (two) times daily., Disp: 30 tablet, Rfl: 0   Review of Systems:   ROS Negative unless otherwise specified per HPI.  Vitals:   Vitals:   08/24/20 0902  BP: 120/66  Pulse: 85  Temp: 97.6 F  (36.4 C)  TempSrc: Temporal  SpO2: 96%  Weight: 234 lb 4 oz (106.3 kg)  Height: 5\' 4"  (1.626 m)     Body mass index is 40.21 kg/m.  Physical Exam:   Physical Exam Constitutional:      Appearance: She is well-developed.  HENT:     Head: Normocephalic and atraumatic.  Eyes:     Conjunctiva/sclera: Conjunctivae normal.  Pulmonary:     Effort: Pulmonary effort is normal.  Musculoskeletal:        General: Normal range of motion.     Cervical back: Normal range of motion and neck supple.  Feet:     Comments: R Foot Overall foot and ankle are well aligned, no significant deformity.  No significant TTP over the base of the 5th metatarsal, navicular, or posterior aspect of medial or lateral malleolus.  TTP in cuboid region. Slight pain or discomfort with isolated passive forefoot abduction. Skin:    General: Skin is warm and dry.  Neurological:     Mental Status: She is alert and oriented to person, place, and time.  Psychiatric:        Behavior: Behavior normal.  Thought Content: Thought content normal.        Judgment: Judgment normal.       Assessment and Plan:   Chenell was seen today for foot pain.  Diagnoses and all orders for this visit:  Right foot pain Due to point tenderness, will obtain xray for further evaluation, r/o possible stress fracture.  Trial diclofenac to see if this helps with inflammation. Rest, ice and elevate over weekend. If no improvement of symptoms, recommend evaluation at sports medicine office. -     DG Foot Complete Right; Future  Other orders -     diclofenac (VOLTAREN) 75 MG EC tablet; Take 1 tablet (75 mg total) by mouth 2 (two) times daily.  CMA or LPN served as scribe during this visit. History, Physical, and Plan performed by medical provider. The above documentation has been reviewed and is accurate and complete.  Jarold Motto, PA-C

## 2020-08-30 ENCOUNTER — Telehealth: Payer: Self-pay

## 2020-08-30 DIAGNOSIS — M79671 Pain in right foot: Secondary | ICD-10-CM

## 2020-08-30 NOTE — Telephone Encounter (Signed)
Please see message and advise 

## 2020-08-30 NOTE — Telephone Encounter (Signed)
Pt called stating she is still experiencing foot pain. Pt asked if a referral could be put in for her to go to a specialist. Please advise.

## 2020-08-30 NOTE — Telephone Encounter (Signed)
Spoke to pt told her referral placed for foot pain and someone will be contacting you to schedule an appt. Pt verbalized understanding. Referral for [facility] placed.

## 2020-08-30 NOTE — Telephone Encounter (Signed)
Referral to [facility]

## 2020-09-09 ENCOUNTER — Other Ambulatory Visit: Payer: Self-pay

## 2020-09-09 ENCOUNTER — Encounter: Payer: Self-pay | Admitting: Physician Assistant

## 2020-09-09 ENCOUNTER — Ambulatory Visit (INDEPENDENT_AMBULATORY_CARE_PROVIDER_SITE_OTHER): Payer: 59 | Admitting: Physician Assistant

## 2020-09-09 VITALS — BP 120/80 | HR 73 | Temp 97.3°F | Ht 64.5 in | Wt 231.0 lb

## 2020-09-09 DIAGNOSIS — Z136 Encounter for screening for cardiovascular disorders: Secondary | ICD-10-CM

## 2020-09-09 DIAGNOSIS — E559 Vitamin D deficiency, unspecified: Secondary | ICD-10-CM | POA: Diagnosis not present

## 2020-09-09 DIAGNOSIS — R5383 Other fatigue: Secondary | ICD-10-CM | POA: Diagnosis not present

## 2020-09-09 DIAGNOSIS — Z13220 Encounter for screening for lipoid disorders: Secondary | ICD-10-CM

## 2020-09-09 DIAGNOSIS — Z1159 Encounter for screening for other viral diseases: Secondary | ICD-10-CM

## 2020-09-09 DIAGNOSIS — E669 Obesity, unspecified: Secondary | ICD-10-CM

## 2020-09-09 DIAGNOSIS — Z0001 Encounter for general adult medical examination with abnormal findings: Secondary | ICD-10-CM | POA: Diagnosis not present

## 2020-09-09 MED ORDER — PHENTERMINE HCL 37.5 MG PO TABS: ORAL_TABLET | ORAL | 0 refills | Status: DC

## 2020-09-09 NOTE — Progress Notes (Signed)
I acted as a Neurosurgeon for [employer], PA-C Corky Mull, LPN   Subjective:    Kelsey Curtis is a 29 y.o. female and is here for a comprehensive physical exam.  HPI  Health Maintenance Due  Topic Date Due  . Hepatitis C Screening  Never done    Acute Concerns: Fatigue -- continues to work 10-12 hour shifts 5 days a week. Has limited ability to exercise. She reports her husband is on B12 injections, she is interested in this. She rarely eats breakfast, has been trying to do better about bring her lunch to work, and occasionally will eat late dinner due to work schedule. Feels like her low libido is affected by this - has discussed with gynecology. Denies: unintentional weight loss, rectal bleeding, night sweats.  Chronic Issues: Anxiety -- has buspar prn -- rare use. Trazodone prn.  Health Maintenance: Immunizations -- UTD Colonoscopy -- N/A Mammogram -- N/A PAP -- UTD, due 03/2022 Bone Density -- N/A Diet -- drinking water; eats occasional fruit; limited veggies; limited breakfast Caffeine intake -- limited Sleep habits -- trazodone prn; but overall ok Exercise -- none Current Weight -- Weight: 231 lb (104.8 kg)  Weight History: Wt Readings from Last 10 Encounters:  09/09/20 231 lb (104.8 kg)  08/24/20 234 lb 4 oz (106.3 kg)  07/08/20 232 lb (105.2 kg)  06/20/20 236 lb (107 kg)  03/09/20 230 lb (104.3 kg)  11/03/19 234 lb 8 oz (106.4 kg)  09/22/19 236 lb (107 kg)  09/17/19 235 lb 6.1 oz (106.8 kg)  09/02/19 236 lb 3.2 oz (107.1 kg)  02/04/19 225 lb (102.1 kg)   Body mass index is 39.04 kg/m. Mood -- overall stable Patient's last menstrual period was 09/01/2020. Period characteristics -- overall stable, will occasionally have a light period Birth control -- Apri OCP Alcohol -- rare  Depression screen PHQ 2/9 09/09/2020  Decreased Interest 0  Down, Depressed, Hopeless 0  PHQ - 2 Score 0     Other providers/specialists: Patient Care Team: Jarold Motto, [place] as PCP - General (Physician Assistant)   PMHx, SurgHx, SocialHx, Medications, and Allergies were reviewed in the Visit Navigator and updated as appropriate.   Past Medical History:  Diagnosis Date  . Pyelonephritis      Past Surgical History:  Procedure Laterality Date  . NO PAST SURGERIES       Family History  Problem Relation Age of Onset  . Alzheimer's disease Maternal Grandmother   . Colon cancer Neg Hx   . Breast cancer Neg Hx     Social History   Tobacco Use  . Smoking status: Never Smoker  . Smokeless tobacco: Never Used  Substance Use Topics  . Alcohol use: Yes    Comment: Occ.  . Drug use: No    Review of Systems:   Review of Systems  Constitutional: Negative for chills, fever, malaise/fatigue and weight loss.  HENT: Negative for hearing loss, sinus pain and sore throat.   Eyes: Negative for blurred vision.  Respiratory: Negative for cough and shortness of breath.   Cardiovascular: Negative for chest pain, palpitations and leg swelling.  Gastrointestinal: Negative for abdominal pain, constipation, diarrhea, heartburn, nausea and vomiting.  Genitourinary: Negative for dysuria, frequency and urgency.  Musculoskeletal: Negative for back pain, myalgias and neck pain.  Skin: Negative for itching and rash.  Neurological: Negative for dizziness, tingling, seizures, loss of consciousness and headaches.  Endo/Heme/Allergies: Negative for polydipsia.  Psychiatric/Behavioral: Negative for depression. The patient is not  nervous/anxious.   All other systems reviewed and are negative.   Objective:   BP 120/80 (BP Location: Left Arm, Patient Position: Sitting, Cuff Size: Large)   Pulse 73   Temp (!) 97.3 F (36.3 C) (Temporal)   Ht 5' 4.5" (1.638 m)   Wt 231 lb (104.8 kg)   LMP 09/01/2020   SpO2 96%   BMI 39.04 kg/m   General Appearance:    Alert, cooperative, no distress, appears stated age  Head:    Normocephalic, without obvious abnormality,  atraumatic  Eyes:    PERRL, conjunctiva/corneas clear, EOM's intact, fundi    benign, both eyes  Ears:    Normal TM's and external ear canals, both ears  Nose:   Nares normal, septum midline, mucosa normal, no drainage    or sinus tenderness  Throat:   Lips, mucosa, and tongue normal; teeth and gums normal  Neck:   Supple, symmetrical, trachea midline, no adenopathy;    thyroid:  no enlargement/tenderness/nodules; no carotid   bruit or JVD  Back:     Symmetric, no curvature, ROM normal, no CVA tenderness  Lungs:     Clear to auscultation bilaterally, respirations unlabored  Chest Wall:    No tenderness or deformity   Heart:    Regular rate and rhythm, S1 and S2 normal, no murmur, rub   or gallop  Breast Exam:    Deferred  Abdomen:     Soft, non-tender, bowel sounds active all four quadrants,    no masses, no organomegaly  Genitalia:    Deferred  Rectal:    Deferred  Extremities:   Extremities normal, atraumatic, no cyanosis or edema  Pulses:   2+ and symmetric all extremities  Skin:   Skin color, texture, turgor normal, no rashes or lesions  Lymph nodes:   Cervical, supraclavicular, and axillary nodes normal  Neurologic:   CNII-XII intact, normal strength, sensation and reflexes    throughout     Assessment/Plan:   Leland was seen today for annual exam.  Diagnoses and all orders for this visit:  Encounter for general adult medical examination with abnormal findings Today patient counseled on age appropriate routine health concerns for screening and prevention, each reviewed and up to date or declined. Immunizations reviewed and up to date or declined. Labs ordered and reviewed. Risk factors for depression reviewed and negative. Hearing function and visual acuity are intact. ADLs screened and addressed as needed. Functional ability and level of safety reviewed and appropriate. Education, counseling and referrals performed based on assessed risks today. Patient provided with a copy  of personalized plan for preventive services.  Vitamin D deficiency Update Vit D level today and provide recommendations accordingly. -     VITAMIN D 25 Hydroxy (Vit-D Deficiency, Fractures); Future  Obesity, unspecified classification, unspecified obesity type, unspecified whether serious comorbidity present Reviewed need for healthier diet options. Snack handout provided. Exercise goal of 30 min per week -- initial goal, made today. Start 1/2 tablet phentermine. Discussed risks of medication. Discussed that if she were to develop any chest pain, SOB, other concerning symptoms to stop rx at once. Follow-up in 1-2 months. D/c medication if not losing weight or making appropriate lifestyle changes. -     CBC with Differential/Platelet; Future -     Comprehensive metabolic panel; Future  Fatigue, unspecified type Multifactorial. Suspect due to lifestyle. Will check B12. Encouraged healthy eating and exercise. Will update general labs as well. -     Vitamin B12; Future  Encounter for screening for other viral diseases -     Hepatitis C antibody; Future  Encounter for lipid screening for cardiovascular disease -     Lipid panel; Future  Other orders -     phentermine (ADIPEX-P) 37.5 MG tablet; Take 1/2 tablet daily  Well Adult Exam: Labs ordered: Yes. Patient counseling was done. See below for items discussed. Discussed the patient's BMI. The BMI is not in the acceptable range; BMI management plan is completed Follow up in 1 month.  Patient Counseling:   [x]     Nutrition: Stressed importance of moderation in sodium/caffeine intake, saturated fat and cholesterol, caloric balance, sufficient intake of fresh fruits, vegetables, fiber, calcium, iron, and 1 mg of folate supplement per day (for females capable of pregnancy).   [x]      Stressed the importance of regular exercise.    [x]     Substance Abuse: Discussed cessation/primary prevention of tobacco, alcohol, or other drug  use; driving or other dangerous activities under the influence; availability of treatment for abuse.    [x]      Injury prevention: Discussed safety belts, safety helmets, smoke detector, smoking near bedding or upholstery.    [x]      Sexuality: Discussed sexually transmitted diseases, partner selection, use of condoms, avoidance of unintended pregnancy  and contraceptive alternatives.    [x]     Dental health: Discussed importance of regular tooth brushing, flossing, and dental visits.   [x]      Health maintenance and immunizations reviewed. Please refer to Health maintenance section.   CMA or LPN served as scribe during this visit. History, Physical, and Plan performed by medical provider. The above documentation has been reviewed and is accurate and complete.  , PA-C [facility]

## 2020-09-09 NOTE — Patient Instructions (Addendum)
It was great to see you!  Find time to workout on the weekend.  Start 1/2 tablet of phentermine daily. Follow-up in 1-2 months. Please work on portions -- look at the snack list for some ideas.  Please go to the lab for blood work.   Our office will call you with your results unless you have chosen to receive results via MyChart.  If your blood work is normal we will follow-up each year for physicals and as scheduled for chronic medical problems.  If anything is abnormal we will treat accordingly and get you in for a follow-up.  Take care,  [facility] Maintenance, Female Adopting a healthy lifestyle and getting preventive care are important in promoting health and wellness. Ask your health care provider about:  The right schedule for you to have regular tests and exams.  Things you can do on your own to prevent diseases and keep yourself healthy. What should I know about diet, weight, and exercise? Eat a healthy diet   Eat a diet that includes plenty of vegetables, fruits, low-fat dairy products, and lean protein.  Do not eat a lot of foods that are high in solid fats, added sugars, or sodium. Maintain a healthy weight Body mass index (BMI) is used to identify weight problems. It estimates body fat based on height and weight. Your health care provider can help determine your BMI and help you achieve or maintain a healthy weight. Get regular exercise Get regular exercise. This is one of the most important things you can do for your health. Most adults should:  Exercise for at least 150 minutes each week. The exercise should increase your heart rate and make you sweat (moderate-intensity exercise).  Do strengthening exercises at least twice a week. This is in addition to the moderate-intensity exercise.  Spend less time sitting. Even light physical activity can be beneficial. Watch cholesterol and blood lipids Have your blood tested for lipids and cholesterol at 30  years of age, then have this test every 5 years. Have your cholesterol levels checked more often if:  Your lipid or cholesterol levels are high.  You are older than 30 years of age.  You are at high risk for heart disease. What should I know about cancer screening? Depending on your health history and family history, you may need to have cancer screening at various ages. This may include screening for:  Breast cancer.  Cervical cancer.  Colorectal cancer.  Skin cancer.  Lung cancer. What should I know about heart disease, diabetes, and high blood pressure? Blood pressure and heart disease  High blood pressure causes heart disease and increases the risk of stroke. This is more likely to develop in people who have high blood pressure readings, are of African descent, or are overweight.  Have your blood pressure checked: ? Every 3-5 years if you are 25-42 years of age. ? Every year if you are 51 years old or older. Diabetes Have regular diabetes screenings. This checks your fasting blood sugar level. Have the screening done:  Once every three years after age 6 if you are at a normal weight and have a low risk for diabetes.  More often and at a younger age if you are overweight or have a high risk for diabetes. What should I know about preventing infection? Hepatitis B If you have a higher risk for hepatitis B, you should be screened for this virus. Talk with your health care provider to find out if  you are at risk for hepatitis B infection. Hepatitis C Testing is recommended for:  Everyone born from [birth year] through 1966.  Anyone with known risk factors for hepatitis C. Sexually transmitted infections (STIs)  Get screened for STIs, including gonorrhea and chlamydia, if: ? You are sexually active and are younger than 30 years of age. ? You are older than 30 years of age and your health care provider tells you that you are at risk for this type of infection. ? Your sexual  activity has changed since you were last screened, and you are at increased risk for chlamydia or gonorrhea. Ask your health care provider if you are at risk.  Ask your health care provider about whether you are at high risk for HIV. Your health care provider may recommend a prescription medicine to help prevent HIV infection. If you choose to take medicine to prevent HIV, you should first get tested for HIV. You should then be tested every 3 months for as long as you are taking the medicine. Pregnancy  If you are about to stop having your period (premenopausal) and you may become pregnant, seek counseling before you get pregnant.  Take 400 to 800 micrograms (mcg) of folic acid every day if you become pregnant.  Ask for birth control (contraception) if you want to prevent pregnancy. Osteoporosis and menopause Osteoporosis is a disease in which the bones lose minerals and strength with aging. This can result in bone fractures. If you are 42 years old or older, or if you are at risk for osteoporosis and fractures, ask your health care provider if you should:  Be screened for bone loss.  Take a calcium or vitamin D supplement to lower your risk of fractures.  Be given hormone replacement therapy (HRT) to treat symptoms of menopause. Follow these instructions at home: Lifestyle  Do not use any products that contain nicotine or tobacco, such as cigarettes, e-cigarettes, and chewing tobacco. If you need help quitting, ask your health care provider.  Do not use street drugs.  Do not share needles.  Ask your health care provider for help if you need support or information about quitting drugs. Alcohol use  Do not drink alcohol if: ? Your health care provider tells you not to drink. ? You are pregnant, may be pregnant, or are planning to become pregnant.  If you drink alcohol: ? Limit how much you use to 0-1 drink a day. ? Limit intake if you are breastfeeding.  Be aware of how much  alcohol is in your drink. In the [place] one drink equals one 12 oz bottle of beer (355 mL), one 5 oz glass of wine (148 mL), or one 1 oz glass of hard liquor (44 mL). General instructions  Schedule regular health, dental, and eye exams.  Stay current with your vaccines.  Tell your health care provider if: ? You often feel depressed. ? You have ever been abused or do not feel safe at home. Summary  Adopting a healthy lifestyle and getting preventive care are important in promoting health and wellness.  Follow your health care provider's instructions about healthy diet, exercising, and getting tested or screened for diseases.  Follow your health care provider's instructions on monitoring your cholesterol and blood pressure. This information is not intended to replace advice given to you by your health care provider. Make sure you discuss any questions you have with your health care provider. Document Revised: 10/29/2018 Document Reviewed: 10/29/2018 Elsevier Patient Education  2020 Elsevier  Inc.

## 2020-09-12 ENCOUNTER — Other Ambulatory Visit: Payer: Self-pay | Admitting: Physician Assistant

## 2020-09-12 ENCOUNTER — Telehealth: Payer: Self-pay

## 2020-09-12 ENCOUNTER — Ambulatory Visit: Payer: 59 | Admitting: Podiatry

## 2020-09-12 LAB — CBC WITH DIFFERENTIAL/PLATELET
Absolute Monocytes: 569 cells/uL (ref 200–950)
Basophils Absolute: 29 cells/uL (ref 0–200)
Basophils Relative: 0.4 %
Eosinophils Absolute: 86 cells/uL (ref 15–500)
Eosinophils Relative: 1.2 %
HCT: 40.6 % (ref 35.0–45.0)
Hemoglobin: 13.5 g/dL (ref 11.7–15.5)
Lymphs Abs: 2239 cells/uL (ref 850–3900)
MCH: 28.5 pg (ref 27.0–33.0)
MCHC: 33.3 g/dL (ref 32.0–36.0)
MCV: 85.7 fL (ref 80.0–100.0)
MPV: 9.5 fL (ref 7.5–12.5)
Monocytes Relative: 7.9 %
Neutro Abs: 4277 cells/uL (ref 1500–7800)
Neutrophils Relative %: 59.4 %
Platelets: 301 10*3/uL (ref 140–400)
RBC: 4.74 10*6/uL (ref 3.80–5.10)
RDW: 12.1 % (ref 11.0–15.0)
Total Lymphocyte: 31.1 %
WBC: 7.2 10*3/uL (ref 3.8–10.8)

## 2020-09-12 LAB — COMPREHENSIVE METABOLIC PANEL
AG Ratio: 1.2 (calc) (ref 1.0–2.5)
ALT: 12 U/L (ref 6–29)
AST: 11 U/L (ref 10–30)
Albumin: 3.8 g/dL (ref 3.6–5.1)
Alkaline phosphatase (APISO): 66 U/L (ref 31–125)
BUN: 15 mg/dL (ref 7–25)
CO2: 26 mmol/L (ref 20–32)
Calcium: 9 mg/dL (ref 8.6–10.2)
Chloride: 106 mmol/L (ref 98–110)
Creat: 0.89 mg/dL (ref 0.50–1.10)
Globulin: 3.1 g/dL (calc) (ref 1.9–3.7)
Glucose, Bld: 86 mg/dL (ref 65–99)
Potassium: 4.6 mmol/L (ref 3.5–5.3)
Sodium: 138 mmol/L (ref 135–146)
Total Bilirubin: 0.5 mg/dL (ref 0.2–1.2)
Total Protein: 6.9 g/dL (ref 6.1–8.1)

## 2020-09-12 LAB — HEPATITIS C ANTIBODY
Hepatitis C Ab: NONREACTIVE
SIGNAL TO CUT-OFF: 0.02 (ref <1.00)

## 2020-09-12 LAB — LIPID PANEL
Cholesterol: 230 mg/dL — ABNORMAL HIGH (ref <200)
HDL: 62 mg/dL (ref >=50)
LDL Cholesterol (Calc): 146 mg/dL (calc) — ABNORMAL HIGH
Non-HDL Cholesterol (Calc): 168 mg/dL (calc) — ABNORMAL HIGH (ref <130)
Total CHOL/HDL Ratio: 3.7 (calc) (ref <5.0)
Triglycerides: 105 mg/dL (ref <150)

## 2020-09-12 LAB — VITAMIN B12: Vitamin B-12: 260 pg/mL (ref 200–1100)

## 2020-09-12 LAB — VITAMIN D 25 HYDROXY (VIT D DEFICIENCY, FRACTURES): Vit D, 25-Hydroxy: 19 ng/mL — ABNORMAL LOW (ref 30–100)

## 2020-09-12 MED ORDER — VITAMIN D (ERGOCALCIFEROL) 1.25 MG (50000 UNIT) PO CAPS: 50000.0000 [IU] | ORAL_CAPSULE | ORAL | 0 refills | Status: DC

## 2020-09-12 NOTE — Telephone Encounter (Signed)
Spoke to pt told her she can buy any OTC brand Vit B12 1000 mcg one tablet daily. Pt verbalized understanding.

## 2020-09-12 NOTE — Telephone Encounter (Signed)
Pt would like to do otc b12. She wants to know what brand she should buy and instructions on taking it.

## 2020-10-03 ENCOUNTER — Ambulatory Visit: Payer: 59 | Admitting: Podiatry

## 2020-10-27 IMAGING — DX ABDOMEN - 1 VIEW
1 series · 1 of 1 positions shown · non-contrast
Comparison: 01/22/2017

CLINICAL DATA: Abdominal pain for 1 week

EXAM:
ABDOMEN - 1 VIEW

[kub ap]
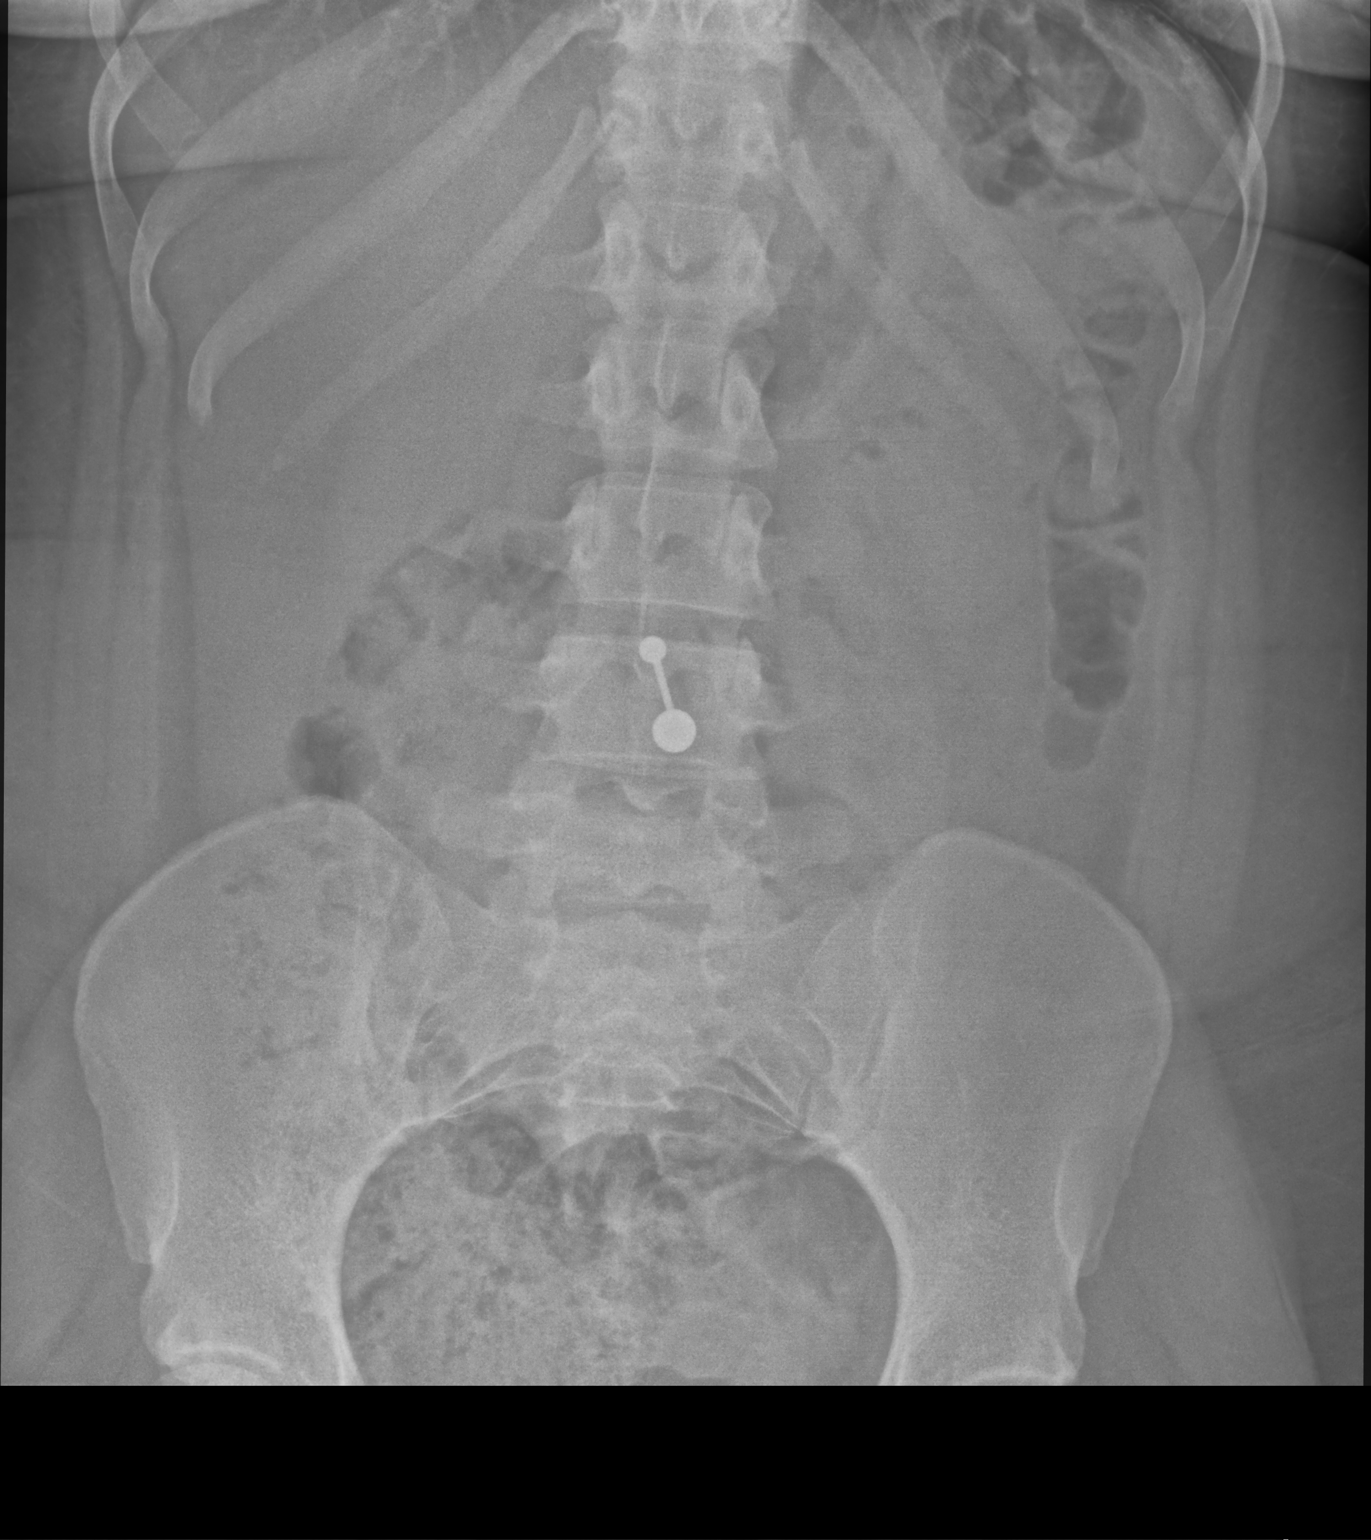

[1 of 1 positions shown; findings below may reference images not displayed]

FINDINGS: Scattered large and small bowel gas is noted. Mild retained fecal
material is again seen within the cecum. Belly button piercing is
seen. No bony abnormality is noted.
IMPRESSION: No acute abnormality noted.

## 2020-12-15 ENCOUNTER — Encounter: Payer: Self-pay | Admitting: Physician Assistant

## 2020-12-15 ENCOUNTER — Telehealth: Payer: Self-pay

## 2020-12-15 NOTE — Telephone Encounter (Signed)
Pt.'s employer is requesting a note stating the days the pt needs to be quarantined. Pt is still experiencing some symptoms. She is fever free as of yesterday. Pt is sending a Radio broadcast assistant with a copy of her covid test.

## 2020-12-15 NOTE — Telephone Encounter (Signed)
See My chart message

## 2020-12-15 NOTE — Telephone Encounter (Signed)
Pt is having problems sending the test. She received the positive result on Monday.

## 2020-12-16 ENCOUNTER — Telehealth (INDEPENDENT_AMBULATORY_CARE_PROVIDER_SITE_OTHER): Payer: 59 | Admitting: Physician Assistant

## 2020-12-16 ENCOUNTER — Encounter: Payer: Self-pay | Admitting: Physician Assistant

## 2020-12-16 VITALS — Temp 97.5°F | Ht 64.5 in | Wt 212.0 lb

## 2020-12-16 DIAGNOSIS — U071 COVID-19: Secondary | ICD-10-CM

## 2020-12-16 NOTE — Progress Notes (Signed)
Virtual Visit via Video   I connected with Kelsey Curtis on 12/16/20 at 12:00 PM EST by a video enabled telemedicine application and verified that I am speaking with the correct person using two identifiers. Location patient: Home Location provider: [facility], Office Persons participating in the virtual visit: Jisselle, Poth PA-C, Corky Mull, LPN   I discussed the limitations of evaluation and management by telemedicine and the availability of in person appointments. The patient expressed understanding and agreed to proceed.  I acted as a Neurosurgeon for [employer], PA-C [employer], LPN   Subjective:   HPI:   Patient is requesting evaluation for possible COVID-19.  Symptom onset: Saturday 01/22  Travel/contacts: exposed at work  Vaccination status: 2 shots  Testing results: Test done Sunday 01/23, results came back Monday Positive  Patient endorses the following symptoms: Cough, headache, body aches, fever 2 days ago has resolved, scratchy throat  Patient denies the following symptoms: Chest pain, SOB, chest tightness, confusion, inability to take PO's  Treatments tried: Coricidin, Mucinex  Patient risk factors: Current COVID-19 risk of complications score: 1 Smoking status: Kelsey Curtis  reports that she has never smoked. She has never used smokeless tobacco. If female, currently pregnant? []   Yes [x]   No  ROS: See pertinent positives and negatives per HPI.  Patient Active Problem List   Diagnosis Date Noted  . 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 Smoking status: Never Smoker  . Smokeless tobacco: Never Used  Substance Use Topics  . Alcohol use: Yes    Comment: Occ.    Current Outpatient Medications:  .  APRI 0.15-30 MG-MCG tablet, , Disp: , Rfl:  .  busPIRone (BUSPAR) 5 MG tablet, Take 5 mg by mouth 3 (three) times daily as needed., Disp: , Rfl:  .  Cholecalciferol (VITAMIN D3) 25 MCG (1000 UT) CAPS,  Take 1 capsule by mouth daily in the afternoon., Disp: , Rfl:  .  naproxen sodium (ALEVE) 220 MG tablet, Take 440 mg by mouth as needed., Disp: , Rfl:  .  traZODone (DESYREL) 50 MG tablet, Take 1 tablet (50 mg total) by mouth at bedtime as needed for sleep., Disp: 90 tablet, Rfl: 0 .  valACYclovir (VALTREX) 1000 MG tablet, Take two tablets ( total 2000 mg) by mouth q12h x 1 day; Start: ASAP after symptom onset, Disp: 6 tablet, Rfl: 2 .  vitamin B-12 (CYANOCOBALAMIN) 1000 MCG tablet, Take 1,000 mcg by mouth daily., Disp: , Rfl:   No Known Allergies  Objective:   VITALS: Per patient if applicable, see vitals. GENERAL: Alert, appears well and in no acute distress. HEENT: Atraumatic, conjunctiva clear, no obvious abnormalities on inspection of external nose and ears. NECK: Normal movements of the head and neck. CARDIOPULMONARY: No increased WOB. Speaking in clear sentences. I:E ratio WNL.  MS: Moves all visible extremities without noticeable abnormality. PSYCH: Pleasant and cooperative, well-groomed. Speech normal rate and rhythm. Affect is appropriate. Insight and judgement are appropriate. Attention is focused, linear, and appropriate.  NEURO: CN grossly intact. Oriented as arrived to appointment on time with no prompting. Moves both UE equally.  SKIN: No obvious lesions, wounds, erythema, or cyanosis noted on face or hands.  Assessment and Plan:   Kelsey Curtis was seen today for covid positive.  Diagnoses and all orders for this visit:  COVID-19   No red flags on discussion, patient is not in any obvious distress during our visit.  Discussed progression  of most viral illnesses, and recommended supportive care at this point in time.  She is doing quite well with her course. She reports that she is able to return to work when symptoms resolve, I have given her a note to return to work Monday however should her symptoms worsen and prevent return to work on this date, I have asked her to  notify us so we can extend time off.  Discussed over the counter supportive care options, including Tylenol 500 mg q 8 hours, with recommendations to push fluids and rest. Reviewed return precautions including new/worsening fever, SOB, new/worsening cough, sudden onset changes of symptoms. Recommended need to self-quarantine and practice social distancing until symptoms resolve. I recommend that patient follow-up if symptoms worsen or persist despite treatment x 7-10 days, sooner if needed.  I discussed the assessment and treatment plan with the patient. The patient was provided an opportunity to ask questions and all were answered. The patient agreed with the plan and demonstrated an understanding of the instructions.   The patient was advised to call back or seek an in-person evaluation if the symptoms worsen or if the condition fails to improve as anticipated.   CMA or LPN served as scribe during this visit. History, Physical, and Plan performed by medical provider. The above documentation has been reviewed and is accurate and complete.   [place], [place] 12/16/2020

## 2021-02-03 ENCOUNTER — Ambulatory Visit (INDEPENDENT_AMBULATORY_CARE_PROVIDER_SITE_OTHER): Payer: 59 | Admitting: Physician Assistant

## 2021-02-03 ENCOUNTER — Encounter: Payer: Self-pay | Admitting: Physician Assistant

## 2021-02-03 ENCOUNTER — Other Ambulatory Visit: Payer: Self-pay

## 2021-02-03 VITALS — BP 110/68 | HR 68 | Temp 98.4°F | Ht 64.5 in | Wt 213.2 lb

## 2021-02-03 DIAGNOSIS — L304 Erythema intertrigo: Secondary | ICD-10-CM | POA: Diagnosis not present

## 2021-02-03 MED ORDER — CLOTRIMAZOLE 1 % EX CREA: 1.0000 "application " | TOPICAL_CREAM | Freq: Two times a day (BID) | CUTANEOUS | 0 refills | Status: DC

## 2021-02-03 NOTE — Patient Instructions (Signed)
Please use the clotrimazole as directed. Keep area cool and dry. Call back in a few weeks if worse or no improvement.

## 2021-02-03 NOTE — Progress Notes (Signed)
Acute Office Visit  Subjective:    Patient ID: Kelsey Curtis, female    DOB: [birth year], 30 y.o.   MRN: [id number]  Chief Complaint  Patient presents with  . Rash    X 2weeks    HPI Patient is in today for dark patches on her R breast x 2 weeks. Appeared suddenly. No itching, no burning, no pain. Hasn't tried any creams. Nobody else has any rashes. LMP 01/16/21, no chance of pregnancy per patient. No medical conditions per patient.   Past Medical History:  Diagnosis Date  . Pyelonephritis     Past Surgical History:  Procedure Laterality Date  . NO PAST SURGERIES      Family History  Problem Relation Age of Onset  . Alzheimer's disease Maternal Grandmother   . Colon cancer Neg Hx   . Breast cancer Neg Hx     Social History   Socioeconomic History  . Marital status: Married    Spouse name: Not on file  . Number of children: Not on file  . Years of education: Not on file  . Highest education level: Not on file  Occupational History  . Not on file  Tobacco Use  . Smoking status: Never Smoker  . Smokeless tobacco: Never Used  Substance and Sexual Activity  . Alcohol use: Yes    Comment: Occ.  . Drug use: No  . Sexual activity: Yes    Birth control/protection: Pill  Other Topics Concern  . 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 APRI 0.15-30 MG-MCG tablet     . busPIRone (BUSPAR) 5 MG tablet Take 5 mg by mouth 3 (three) times daily as needed.     . Cholecalciferol (VITAMIN D3) 25 MCG (1000 UT) CAPS Take 1 capsule by mouth daily in the afternoon.    . naproxen sodium (ALEVE) 220 MG tablet Take 440 mg by mouth as needed.    . traZODone (DESYREL) 50 MG tablet Take 1 tablet (50 mg total) by mouth at bedtime as needed for sleep. 90 tablet 0  . valACYclovir (VALTREX) 1000 MG tablet Take two tablets ( total 2000 mg) by mouth q12h x 1 day; Start: ASAP after symptom onset 6 tablet 2  . vitamin B-12 (CYANOCOBALAMIN) 1000 MCG tablet Take 1,000 mcg by mouth daily.     No facility-administered medications prior to visit.    No Known Allergies  Review of Systems +Rash, otherwise negative systems review    Objective:    Physical Exam Vitals and nursing note reviewed.  Chest:       BP 110/68   Pulse 68   Temp 98.4 F (36.9 C)   Ht 5' 4.5" (1.638 m)   Wt 213 lb 3.2 oz (96.7 kg)   LMP 01/11/2021   SpO2 100%   BMI 36.03 kg/m  Wt Readings from Last 3 Encounters:  02/03/21 213 lb 3.2 oz (96.7 kg)  12/16/20 212 lb (96.2 kg)  09/09/20 231 lb (104.8 kg)    Health Maintenance Due  Topic Date Due  . COVID-19 Vaccine (3 - Booster for Pfizer series) 09/06/2020    There are no preventive care reminders to display for this patient.   Lab Results  Component Value Date   TSH 0.66 07/08/2020   Lab Results  Component Value Date   WBC 7.2 09/09/2020   HGB 13.5 09/09/2020   HCT 40.6 09/09/2020   MCV 85.7 09/09/2020   PLT 301 09/09/2020   Lab Results  Component Value Date   NA 138 09/09/2020   K 4.6 09/09/2020   CO2 26 09/09/2020   GLUCOSE 86 09/09/2020   BUN 15 09/09/2020   CREATININE 0.89 09/09/2020   BILITOT 0.5 09/09/2020   ALKPHOS 74 09/02/2019   AST 11 09/09/2020   ALT 12 09/09/2020   PROT 6.9 09/09/2020   ALBUMIN 3.9 09/02/2019   CALCIUM 9.0 09/09/2020   ANIONGAP 8 02/04/2019   GFR 112.02 09/02/2019   Lab Results  Component Value Date   CHOL 230 (H) 09/09/2020   Lab Results  Component Value Date   HDL  62 09/09/2020   Lab Results  Component Value Date   LDLCALC 146 (H) 09/09/2020   Lab Results  Component Value Date   TRIG 105 09/09/2020   Lab Results  Component Value Date   CHOLHDL 3.7 09/09/2020   Lab Results  Component Value Date   HGBA1C 5.3 09/19/2017       Assessment & Plan:   Problem List Items Addressed This Visit   None   Visit Diagnoses    Intertrigo    -  Primary       Meds ordered this encounter  Medications  . clotrimazole (LOTRIMIN) 1 % cream    Sig: Apply 1 application topically 2 (two) times daily.    Dispense:  30 g    Refill:  0   1. Intertrigo Evaluated with Dr. Artis Flock as well. Possibly intertrigo vs tinea. Will treat with clotrimazole. Keep area cool and dry. Recheck if worse or no improvement.  This visit occurred during the SARS-CoV-2 public health emergency.  Safety protocols were in place, including screening questions prior to the visit, additional usage of staff PPE, and extensive cleaning of exam room while observing appropriate contact time as indicated for disinfecting solutions.    Edker Punt M Haik Mahoney, PA-C

## 2021-02-07 ENCOUNTER — Ambulatory Visit: Payer: 59 | Admitting: Physician Assistant

## 2021-02-27 ENCOUNTER — Ambulatory Visit (INDEPENDENT_AMBULATORY_CARE_PROVIDER_SITE_OTHER): Payer: 59 | Admitting: Physician Assistant

## 2021-02-27 ENCOUNTER — Other Ambulatory Visit: Payer: Self-pay | Admitting: Physician Assistant

## 2021-02-27 ENCOUNTER — Other Ambulatory Visit: Payer: Self-pay

## 2021-02-27 ENCOUNTER — Encounter: Payer: Self-pay | Admitting: Physician Assistant

## 2021-02-27 VITALS — BP 110/68 | HR 77 | Temp 97.6°F | Ht 64.5 in | Wt 215.0 lb

## 2021-02-27 DIAGNOSIS — N644 Mastodynia: Secondary | ICD-10-CM

## 2021-02-27 DIAGNOSIS — R234 Changes in skin texture: Secondary | ICD-10-CM

## 2021-02-27 DIAGNOSIS — F5101 Primary insomnia: Secondary | ICD-10-CM | POA: Diagnosis not present

## 2021-02-27 LAB — POCT URINE PREGNANCY: Preg Test, Ur: NEGATIVE

## 2021-02-27 MED ORDER — TRAZODONE HCL 50 MG PO TABS: 50.0000 mg | ORAL_TABLET | Freq: Every evening | ORAL | 3 refills | Status: DC | PRN

## 2021-02-27 MED ORDER — NYSTATIN 100000 UNIT/GM EX POWD: 1.0000 "application " | Freq: Three times a day (TID) | CUTANEOUS | 0 refills | Status: DC

## 2021-02-27 MED ORDER — FLUCONAZOLE 150 MG PO TABS: 150.0000 mg | ORAL_TABLET | Freq: Once | ORAL | 0 refills | Status: AC

## 2021-02-27 NOTE — Progress Notes (Signed)
Kelsey Curtis is a 30 y.o. female here for a new problem.  I acted as a Neurosurgeon for [employer], PA-C Kelsey Mull, LPN   History of Present Illness:   Chief Complaint  Patient presents with  . Breast Pain  . Rash    HPI  Breast pain Pt c/o left breast pain x 1 week, has improved. Denies discharge from nipple. She had a small superficial bump to the top of her L nipple. Feels like it has gone away but she is still concerns.  Rash Pt c/o rash still present right breast, itching. She was in our office about two weeks ago and dx with intertrigo was told to use Clotrimazole cream BID. She used this as prescribed but had little improvement.  Insomnia Dealing with significant anxiety at work. The trazodone has been helpful. She would like to continue this.  Patient's last menstrual period was 01/31/2021.   Past Medical History:  Diagnosis Date  . Pyelonephritis      Social History   Tobacco Use  . Smoking status: Never Smoker  . Smokeless tobacco: Never Used  Substance Use Topics  . Alcohol use: Yes    Comment: Occ.  . Drug use: No    Past Surgical History:  Procedure Laterality Date  . NO PAST SURGERIES      Family History  Problem Relation Age of Onset  . Alzheimer's disease Maternal Grandmother   . Colon cancer Neg Hx   . Breast cancer Neg Hx     No Known Allergies  Current Medications:   Current Outpatient Medications:  .  APRI 0.15-30 MG-MCG tablet, , Disp: , Rfl:  .  busPIRone (BUSPAR) 5 MG tablet, Take 5 mg by mouth 3 (three) times daily as needed., Disp: , Rfl:  .  Cholecalciferol (VITAMIN D3) 25 MCG (1000 UT) CAPS, Take 1 capsule by mouth daily in the afternoon., Disp: , Rfl:  .  clotrimazole (LOTRIMIN) 1 % cream, Apply 1 application topically 2 (two) times daily., Disp: 30 g, Rfl: 0 .  naproxen sodium (ALEVE) 220 MG tablet, Take 440 mg by mouth as needed., Disp: , Rfl:  .  nystatin (MYCOSTATIN/NYSTOP) powder, Apply 1 application topically 3  (three) times daily., Disp: 60 g, Rfl: 0 .  valACYclovir (VALTREX) 1000 MG tablet, Take two tablets ( total 2000 mg) by mouth q12h x 1 day; Start: ASAP after symptom onset, Disp: 6 tablet, Rfl: 2 .  vitamin B-12 (CYANOCOBALAMIN) 1000 MCG tablet, Take 1,000 mcg by mouth daily., Disp: , Rfl:  .  traZODone (DESYREL) 50 MG tablet, Take 1 tablet (50 mg total) by mouth at bedtime as needed for sleep., Disp: 90 tablet, Rfl: 3   Review of Systems:   ROS Negative unless otherwise specified per HPI.  Vitals:   Vitals:   02/27/21 0841  BP: 110/68  Pulse: 77  Temp: 97.6 F (36.4 C)  TempSrc: Temporal  SpO2: 98%  Weight: 215 lb (97.5 kg)  Height: 5' 4.5" (1.638 m)     Body mass index is 36.33 kg/m.  Physical Exam:   Physical Exam Vitals and nursing note reviewed.  Constitutional:      General: She is not in acute distress.    Appearance: She is well-developed. She is not ill-appearing or toxic-appearing.  Cardiovascular:     Rate and Rhythm: Normal rate and regular rhythm.     Pulses: Normal pulses.     Heart sounds: Normal heart sounds, S1 normal and S2 normal.  Comments: No LE edema Pulmonary:     Effort: Pulmonary effort is normal.     Breath sounds: Normal breath sounds.  Chest:  Breasts:     Breasts are asymmetrical.      Comments: Hyperpigmented medial R breast and under R breast region  No palpable lump in L breast Skin:    General: Skin is warm and dry.  Neurological:     Mental Status: She is alert.     GCS: GCS eye subscore is 4. GCS verbal subscore is 5. GCS motor subscore is 6.  Psychiatric:        Speech: Speech normal.        Behavior: Behavior normal. Behavior is cooperative.    Results for orders placed or performed in visit on 02/27/21  POCT urine pregnancy  Result Value Ref Range   Preg Test, Ur Negative Negative     Assessment and Plan:   [name] was seen today for breast pain and rash.  Diagnoses and all orders for this visit:  Breast  skin changes Not responding to clotrimazole. Will trial nystatin powder. Add one time dose of oral diflucan. Follow-up based on symptoms. -     POCT urine pregnancy -     MM Digital Diagnostic Bilat; Future  Breast pain Referral for mammogram.  Primary insomnia Well controlled with trazodone.  Other orders -     traZODone (DESYREL) 50 MG tablet; Take 1 tablet (50 mg total) by mouth at bedtime as needed for sleep. -     nystatin (MYCOSTATIN/NYSTOP) powder; Apply 1 application topically 3 (three) times daily.   CMA or LPN served as scribe during this visit. History, Physical, and Plan performed by medical provider. The above documentation has been reviewed and is accurate and complete.   Kelsey Motto, PA-C

## 2021-02-27 NOTE — Patient Instructions (Signed)
It was great to see you!  Urine preg today  Try topical nystatin powder  You will be contacted about your breast imaging  Take care,  Jarold Motto PA-C

## 2021-03-03 ENCOUNTER — Other Ambulatory Visit: Payer: Self-pay

## 2021-03-03 ENCOUNTER — Ambulatory Visit: Payer: 59

## 2021-03-03 ENCOUNTER — Ambulatory Visit
Admission: RE | Admit: 2021-03-03 | Discharge: 2021-03-03 | Disposition: A | Discharge status: Home / Self Care | Payer: 59 | Source: Ambulatory Visit | Attending: Physician Assistant | Admitting: Physician Assistant

## 2021-03-03 DIAGNOSIS — R234 Changes in skin texture: Secondary | ICD-10-CM

## 2021-07-11 ENCOUNTER — Encounter: Payer: Self-pay | Admitting: Physician Assistant

## 2021-07-11 ENCOUNTER — Other Ambulatory Visit: Payer: Self-pay

## 2021-07-11 ENCOUNTER — Ambulatory Visit (INDEPENDENT_AMBULATORY_CARE_PROVIDER_SITE_OTHER): Payer: 59 | Admitting: Physician Assistant

## 2021-07-11 VITALS — BP 100/70 | HR 76 | Temp 97.9°F | Ht 64.5 in | Wt 227.5 lb

## 2021-07-11 DIAGNOSIS — F419 Anxiety disorder, unspecified: Secondary | ICD-10-CM

## 2021-07-11 MED ORDER — BUSPIRONE HCL 5 MG PO TABS: 5.0000 mg | ORAL_TABLET | Freq: Three times a day (TID) | ORAL | 1 refills | Status: DC | PRN

## 2021-07-11 NOTE — Patient Instructions (Signed)
It was great to see you!  Buspar 5 mg as needed for panic attacks  Re-start therapy, I'll put new referral in for you  Take 1/2 tablet of trazodone at night for sleeping  Let's follow-up in 3 months, sooner if you have concerns.  Take care,  Jarold Motto PA-C

## 2021-07-11 NOTE — Progress Notes (Signed)
Kelsey Curtis is a 30 y.o. female here for a new problem.  I acted as a Neurosurgeon for [employer], PA-C [employer], LPN   History of Present Illness:   Chief Complaint  Patient presents with   Panic Attack   Anxiety    HPI  Panic attack Pt has been under a lot of stress lately and had panic attack at work on Friday and had to go home. Pt only has Trazodone at night uses more often than night, used to be on Buspar but has been off x 2 yrs.  She states that BuSpar was helpful for her and she would like to resume this.  She was seeing Kelsey Curtis but she has retired, patient is interested in restarting therapy.  She has situational anxiety currently as her husband tries to find a new job.  Denies SI/HI.    Past Medical History:  Diagnosis Date   Pyelonephritis      Social History   Tobacco Use   Smoking status: Never   Smokeless tobacco: Never  Substance Use Topics   Alcohol use: Yes    Comment: Occ.   Drug use: No    Past Surgical History:  Procedure Laterality Date   NO PAST SURGERIES      Family History  Problem Relation Age of Onset   Alzheimer's disease Maternal Grandmother    Colon cancer Neg Hx    Breast cancer Neg Hx     No Known Allergies  Current Medications:   Current Outpatient Medications:    APRI 0.15-30 MG-MCG tablet, , Disp: , Rfl:    Multiple Vitamins-Minerals (ONE-A-DAY WOMENS PO), Take 1 tablet by mouth daily in the afternoon., Disp: , Rfl:    naproxen sodium (ALEVE) 220 MG tablet, Take 440 mg by mouth as needed., Disp: , Rfl:    traZODone (DESYREL) 50 MG tablet, Take 1 tablet (50 mg total) by mouth at bedtime as needed for sleep., Disp: 90 tablet, Rfl: 3   valACYclovir (VALTREX) 1000 MG tablet, Take two tablets ( total 2000 mg) by mouth q12h x 1 day; Start: ASAP after symptom onset, Disp: 6 tablet, Rfl: 2   busPIRone (BUSPAR) 5 MG tablet, Take 1 tablet (5 mg total) by mouth 3 (three) times daily as needed., Disp: 90 tablet, Rfl: 1    Review of Systems:   ROS Negative unless otherwise specified per HPI.  Vitals:   Vitals:   07/11/21 1115  BP: 100/70  Pulse: 76  Temp: 97.9 F (36.6 C)  TempSrc: Temporal  Weight: 227 lb 8 oz (103.2 kg)  Height: 5' 4.5" (1.638 m)     Body mass index is 38.45 kg/m.  Physical Exam:   Physical Exam Vitals and nursing note reviewed.  Constitutional:      General: She is not in acute distress.    Appearance: She is well-developed. She is not ill-appearing or toxic-appearing.  Cardiovascular:     Rate and Rhythm: Normal rate and regular rhythm.     Pulses: Normal pulses.     Heart sounds: Normal heart sounds, S1 normal and S2 normal.     Comments: No LE edema Pulmonary:     Effort: Pulmonary effort is normal.     Breath sounds: Normal breath sounds.  Skin:    General: Skin is warm and dry.  Neurological:     Mental Status: She is alert.     GCS: GCS eye subscore is 4. GCS verbal subscore is 5. GCS motor subscore  is 6.  Psychiatric:        Speech: Speech normal.        Behavior: Behavior normal. Behavior is cooperative.      Assessment and Plan:   Kelsey Curtis was seen today for panic attack and anxiety.  Diagnoses and all orders for this visit:  Anxiety -     Ambulatory referral to Psychology  Other orders -     busPIRone (BUSPAR) 5 MG tablet; Take 1 tablet (5 mg total) by mouth 3 (three) times daily as needed.  Uncontrolled Will resume as needed 5 mg BuSpar up to 3 times daily Recommend follow-up in 3 months, sooner if concerns Will place new referral for therapy Consider taking trazodone, 25 mg, more regularly to better help with sleep I discussed with patient that if they develop any SI, to tell someone immediately and seek medical attention.  CMA or LPN served as scribe during this visit. History, Physical, and Plan performed by medical provider. The above documentation has been reviewed and is accurate and complete.  Kelsey Motto, PA-C

## 2021-08-04 ENCOUNTER — Other Ambulatory Visit: Payer: Self-pay | Admitting: Physician Assistant

## 2021-08-29 ENCOUNTER — Emergency Department (HOSPITAL_BASED_OUTPATIENT_CLINIC_OR_DEPARTMENT_OTHER): Payer: 59

## 2021-08-29 ENCOUNTER — Encounter (HOSPITAL_BASED_OUTPATIENT_CLINIC_OR_DEPARTMENT_OTHER): Payer: Self-pay

## 2021-08-29 ENCOUNTER — Other Ambulatory Visit: Payer: Self-pay

## 2021-08-29 DIAGNOSIS — R072 Precordial pain: Secondary | ICD-10-CM | POA: Insufficient documentation

## 2021-08-29 LAB — BASIC METABOLIC PANEL
Anion gap: 8 (ref 5–15)
BUN: 10 mg/dL (ref 6–20)
CO2: 22 mmol/L (ref 22–32)
Calcium: 8.6 mg/dL — ABNORMAL LOW (ref 8.9–10.3)
Chloride: 104 mmol/L (ref 98–111)
Creatinine, Ser: 0.76 mg/dL (ref 0.44–1.00)
GFR, Estimated: >60 mL/min (ref >60)
Glucose, Bld: 96 mg/dL (ref 70–99)
Potassium: 3.9 mmol/L (ref 3.5–5.1)
Sodium: 134 mmol/L — ABNORMAL LOW (ref 135–145)

## 2021-08-29 LAB — PREGNANCY, URINE: Preg Test, Ur: NEGATIVE

## 2021-08-29 LAB — CBC
HCT: 41.7 % (ref 36.0–46.0)
Hemoglobin: 13.8 g/dL (ref 12.0–15.0)
MCH: 27.9 pg (ref 26.0–34.0)
MCHC: 33.1 g/dL (ref 30.0–36.0)
MCV: 84.2 fL (ref 80.0–100.0)
Platelets: 308 10*3/uL (ref 150–400)
RBC: 4.95 MIL/uL (ref 3.87–5.11)
RDW: 12.5 % (ref 11.5–15.5)
WBC: 11.1 10*3/uL — ABNORMAL HIGH (ref 4.0–10.5)
nRBC: 0 % (ref 0.0–0.2)

## 2021-08-29 LAB — TROPONIN I (HIGH SENSITIVITY): Troponin I (High Sensitivity): <2 ng/L (ref <18)

## 2021-08-29 NOTE — ED Triage Notes (Signed)
Pt c/o CP x 3-4 days-denies fever/flu sx-NAD-steady gait

## 2021-08-30 ENCOUNTER — Emergency Department (HOSPITAL_BASED_OUTPATIENT_CLINIC_OR_DEPARTMENT_OTHER)
Admission: EM | Admit: 2021-08-30 | Discharge: 2021-08-30 | Disposition: A | Discharge status: Home / Self Care | Payer: 59 | Attending: Emergency Medicine | Admitting: Emergency Medicine

## 2021-08-30 ENCOUNTER — Encounter (HOSPITAL_BASED_OUTPATIENT_CLINIC_OR_DEPARTMENT_OTHER): Payer: Self-pay | Admitting: Emergency Medicine

## 2021-08-30 DIAGNOSIS — R072 Precordial pain: Secondary | ICD-10-CM

## 2021-08-30 LAB — D-DIMER, QUANTITATIVE: D-Dimer, Quant: <0.27 ug/mL-FEU (ref 0.00–0.50)

## 2021-08-30 NOTE — ED Notes (Signed)
Discharge instructions discussed with pt. Pt verbalized understanding. Pt stable and ambulatory. No signature pad available. 

## 2021-08-30 NOTE — ED Provider Notes (Signed)
[facility] EMERGENCY DEPARTMENT Provider Note   CSN: [id number] Arrival date & time: 08/29/21  2201     History Chief Complaint  Patient presents with   Chest Pain    Kelsey Curtis is a 30 y.o. female.  The history is provided by the patient.  Chest Pain Pain location:  Substernal area Pain quality: dull   Pain radiates to:  Does not radiate Pain severity:  Moderate Onset quality:  Gradual Duration:  4 days Timing:  Constant Progression:  Unchanged Chronicity:  New Context: at rest   Relieved by:  Nothing Worsened by:  Nothing Ineffective treatments:  None tried Associated symptoms: no abdominal pain, no AICD problem, no altered mental status, no anorexia, no anxiety, no back pain, no claudication, no cough, no diaphoresis, no dizziness, no dysphagia, no fatigue, no fever, no headache, no heartburn, no lower extremity edema, no nausea, no near-syncope, no numbness, no orthopnea, no palpitations, no PND, no shortness of breath, no syncope, no vomiting and no weakness   Risk factors: no aortic disease   No leg pain no travel.      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     Family History  Problem Relation Age of Onset   Alzheimer's disease Maternal Grandmother    Colon cancer Neg Hx    Breast cancer Neg Hx     Social History   Tobacco Use   Smoking status: Never   Smokeless tobacco: Never  Vaping Use   Vaping Use: Never used  Substance Use Topics   Alcohol use: Yes    Comment: occ   Drug use: No    Home Medications Prior to Admission medications   Medication Sig Start Date End Date Taking? Authorizing Provider  APRI 0.15-30 MG-MCG tablet  05/01/19   [provider]  busPIRone  (BUSPAR) 5 MG tablet TAKE 1 TABLET BY MOUTH 3 TIMES DAILY AS NEEDED. 08/04/21   Jarold Motto, PA  Multiple Vitamins-Minerals (ONE-A-DAY WOMENS PO) Take 1 tablet by mouth daily in the afternoon.    [provider]  naproxen sodium (ALEVE) 220 MG tablet Take 440 mg by mouth as needed.    [provider]  traZODone (DESYREL) 50 MG tablet Take 1 tablet (50 mg total) by mouth at bedtime as needed for sleep. 02/27/21   Jarold Motto, PA  valACYclovir (VALTREX) 1000 MG tablet Take two tablets ( total 2000 mg) by mouth q12h x 1 day; Start: ASAP after symptom onset 01/02/19   Jarold Motto, PA    Allergies    Patient has no known allergies.  Review of Systems   Review of Systems  Constitutional:  Negative for diaphoresis, fatigue and fever.  HENT:  Negative for congestion and trouble swallowing.   Eyes:  Negative for redness.  Respiratory:  Negative for cough and shortness of breath.   Cardiovascular:  Positive for chest pain. Negative for palpitations, orthopnea, claudication, syncope, PND and near-syncope.  Gastrointestinal:  Negative for abdominal pain, anorexia, heartburn, nausea and vomiting.  Genitourinary:  Negative for difficulty urinating.  Musculoskeletal:  Negative for back pain.  Neurological:  Negative for dizziness, weakness, numbness and headaches.  Psychiatric/Behavioral:  Negative for  agitation.   All other systems reviewed and are negative.  Physical Exam Updated Vital Signs BP 132/80   Pulse 73   Temp 98.3 F (36.8 C) (Oral)   Resp (!) 22   Ht 5\' 5"  (1.651 m)   Wt 103.4 kg   LMP 08/24/2021   SpO2 100%   BMI 37.94 kg/m   Physical Exam Vitals and nursing note reviewed.  Constitutional:      General: She is not in acute distress.    Appearance: Normal appearance.  HENT:     Head: Normocephalic and atraumatic.     Nose: Nose normal.  Eyes:     Conjunctiva/sclera: Conjunctivae normal.     Pupils: Pupils are equal, round, and reactive to  light.  Cardiovascular:     Rate and Rhythm: Normal rate and regular rhythm.     Pulses: Normal pulses.     Heart sounds: Normal heart sounds.  Pulmonary:     Effort: Pulmonary effort is normal.     Breath sounds: Normal breath sounds.  Abdominal:     General: Abdomen is flat. Bowel sounds are normal.     Palpations: Abdomen is soft.     Tenderness: There is no abdominal tenderness. There is no guarding.  Musculoskeletal:        General: Normal range of motion.     Cervical back: Normal range of motion and neck supple.  Skin:    General: Skin is warm and dry.     Capillary Refill: Capillary refill takes less than 2 seconds.  Neurological:     General: No focal deficit present.     Mental Status: She is alert and oriented to person, place, and time.     Deep Tendon Reflexes: Reflexes normal.  Psychiatric:        Mood and Affect: Mood normal.        Behavior: Behavior normal.    ED Results / Procedures / Treatments   Labs (all labs ordered are listed, but only abnormal results are displayed) Labs Reviewed  BASIC METABOLIC PANEL - Abnormal; Notable for the following components:      Result Value   Sodium 134 (*)    Calcium 8.6 (*)    All other components within normal limits  CBC - Abnormal; Notable for the following components:   WBC 11.1 (*)    All other components within normal limits  PREGNANCY, URINE  D-DIMER, QUANTITATIVE  TROPONIN I (HIGH SENSITIVITY)    EKG None  Radiology DG Chest 2 View  Result Date: 08/29/2021 CLINICAL DATA:  Chest pain for 3-4 days EXAM: CHEST - 2 VIEW COMPARISON:  02/04/2019 FINDINGS: The heart size and mediastinal contours are within normal limits. Both lungs are clear. The visualized skeletal structures are unremarkable. IMPRESSION: No active cardiopulmonary disease. Electronically Signed   By: 02/06/2019 M.D.   On: 08/29/2021 22:26    Procedures Procedures   Medications Ordered in ED Medications - No data to display  ED  Course  I have reviewed the triage vital signs and the nursing notes.  Pertinent labs & imaging results that were available during my care of the patient were reviewed by me and considered in my medical decision making (see chart for details).   Ruled out for Mi in the ED based on time course.  Heart score is 1 low risk for MACE.  Ruled out for PE with negative dimer.  Symptoms  likely MSK in nature.     10/29/2021  was evaluated in Emergency Department on 08/30/2021 for the symptoms described in the history of present illness. She was evaluated in the context of the global COVID-19 pandemic, which necessitated consideration that the patient might be at risk for infection with the SARS-CoV-2 virus that causes COVID-19. Institutional protocols and algorithms that pertain to the evaluation of patients at risk for COVID-19 are in a state of rapid change based on information released by regulatory bodies including the CDC and federal and state organizations. These policies and algorithms were followed during the patient's care in the ED.  Final Clinical Impression(s) / ED Diagnoses Final diagnoses:  None   Return for intractable cough, coughing up blood, fevers > 100.4 unrelieved by medication, shortness of breath, intractable vomiting, chest pain, shortness of breath, weakness, numbness, changes in speech, facial asymmetry, abdominal pain, passing out, Inability to tolerate liquids or food, cough, altered mental status or any concerns. No signs of systemic illness or infection. The patient is nontoxic-appearing on exam and vital signs are within normal limits.  I have reviewed the triage vital signs and the nursing notes. Pertinent labs & imaging results that were available during my care of the patient were reviewed by me and considered in my medical decision making (see chart for details). After history, exam, and medical workup I feel the patient has been appropriately medically screened and is  safe for discharge home. Pertinent diagnoses were discussed with the patient. Patient was given return precautions.    Rx / DC Orders ED Discharge Orders     None        Ryhanna Dunsmore, MD 08/30/21 0140

## 2021-08-30 NOTE — ED Notes (Addendum)
Incorrect charting

## 2021-09-01 ENCOUNTER — Ambulatory Visit: Payer: 59 | Admitting: Physician Assistant

## 2021-10-17 ENCOUNTER — Ambulatory Visit: Payer: 59 | Admitting: Physician Assistant

## 2021-10-17 NOTE — Progress Notes (Incomplete)
Kelsey Curtis is a 30 y.o. female here for a follow up of anxiety.   SCRIBE STATEMENT  History of Present Illness:   No chief complaint on file.   HPI  Anxiety [name] here for f/u on anxiety. She is currently compliant with buspar 5 mg TID as needed and trazodone 25 mg daily with no adverse effects. *** Past Medical History:  Diagnosis Date   Pyelonephritis      Social History   Tobacco Use   Smoking status: Never   Smokeless tobacco: Never  Vaping Use   Vaping Use: Never used  Substance Use Topics   Alcohol use: Yes    Comment: occ   Drug use: No    Past Surgical History:  Procedure Laterality Date   NO PAST SURGERIES      Family History  Problem Relation Age of Onset   Alzheimer's disease Maternal Grandmother    Colon cancer Neg Hx    Breast cancer Neg Hx     No Known Allergies  Current Medications:   Current Outpatient Medications:    APRI 0.15-30 MG-MCG tablet, , Disp: , Rfl:    busPIRone (BUSPAR) 5 MG tablet, TAKE 1 TABLET BY MOUTH 3 TIMES DAILY AS NEEDED., Disp: 270 tablet, Rfl: 0   Multiple Vitamins-Minerals (ONE-A-DAY WOMENS PO), Take 1 tablet by mouth daily in the afternoon., Disp: , Rfl:    naproxen sodium (ALEVE) 220 MG tablet, Take 440 mg by mouth as needed., Disp: , Rfl:    traZODone (DESYREL) 50 MG tablet, Take 1 tablet (50 mg total) by mouth at bedtime as needed for sleep., Disp: 90 tablet, Rfl: 3   valACYclovir (VALTREX) 1000 MG tablet, Take two tablets ( total 2000 mg) by mouth q12h x 1 day; Start: ASAP after symptom onset, Disp: 6 tablet, Rfl: 2   Review of Systems:   ROS Negative unless otherwise specified per HPI. Vitals:   There were no vitals filed for this visit.   There is no height or weight on file to calculate BMI.  Physical Exam:   Physical Exam  Assessment and Plan:  Anxiety ***    ***  Jarold Motto, PA-C

## 2021-11-08 ENCOUNTER — Other Ambulatory Visit: Payer: Self-pay | Admitting: Physician Assistant

## 2021-11-22 ENCOUNTER — Other Ambulatory Visit: Payer: Self-pay

## 2021-11-22 ENCOUNTER — Other Ambulatory Visit: Payer: Self-pay | Admitting: Physician Assistant

## 2021-11-22 ENCOUNTER — Ambulatory Visit (INDEPENDENT_AMBULATORY_CARE_PROVIDER_SITE_OTHER): Payer: 59 | Admitting: Physician Assistant

## 2021-11-22 ENCOUNTER — Encounter: Payer: Self-pay | Admitting: Physician Assistant

## 2021-11-22 VITALS — BP 115/70 | HR 79 | Temp 98.0°F | Ht 65.0 in | Wt 236.6 lb

## 2021-11-22 DIAGNOSIS — Z23 Encounter for immunization: Secondary | ICD-10-CM | POA: Diagnosis not present

## 2021-11-22 DIAGNOSIS — K5900 Constipation, unspecified: Secondary | ICD-10-CM | POA: Diagnosis not present

## 2021-11-22 NOTE — Patient Instructions (Signed)
It was great to see you!  Start daily capful of miralax (generic is fine) and 1-2 capsules of colace (generic is fine) Do this for three days  After this, increase your miralax to 2 capfuls if still unsuccessful and continue 1-2 capsules of colace  Once having regular bowel movements, slowly decrease the amount of miralax. It is safe to continue colace daily.  Drink AT LEAST 64 oz water daily.  Look at list for more high fiber foods.  If no improvement of symptoms, call me.  Take care,  Jarold Motto PA-C

## 2021-11-22 NOTE — Progress Notes (Signed)
Kelsey Curtis is a 31 y.o. female here for constipation.  History of Present Illness:   Chief Complaint  Patient presents with   Acute Visit    Stomach issues/ constipation    HPI  Constipation Kelsey Curtis presents with c/o intermittent constipation and stomach fullness that has been occurring for years. Pt states that constipation usually begins with the sensation that she is full upon only eating a small amount of food followed by lack of BMs. In the past she has seen Dr. Fuller Plan, gastroenterology, about this issue and was prescribed linzess 145 mcg. Pt was compliant with this medication but following a bowel accident at work, she decided the medication was too strong and discontinued use. Since then she has not been taking any other than miralax and dulcolax, usually one dose per day x 7 days.   Currently she reports that she has been taking mirlax, 1 cap daily x 3 days and decided to drink some warm prune juice with it last night. Kelsey Curtis states that she did have a bowel movement but it was significantly small and didn't provide much relief. Although pt admits that she does not intake nearly as much water as she should, she is concerned if there is a bigger issue occurring. Denies nausea, hematochezia, or rectal bleeding.   Past Medical History:  Diagnosis Date   Pyelonephritis      Social History   Tobacco Use   Smoking status: Never   Smokeless tobacco: Never  Vaping Use   Vaping Use: Never used  Substance Use Topics   Alcohol use: Yes    Comment: occ   Drug use: No    Past Surgical History:  Procedure Laterality Date   NO PAST SURGERIES      Family History  Problem Relation Age of Onset   Alzheimer's disease Maternal Grandmother    Colon cancer Neg Hx    Breast cancer Neg Hx     No Known Allergies  Current Medications:   Current Outpatient Medications:    APRI 0.15-30 MG-MCG tablet, , Disp: , Rfl:    busPIRone (BUSPAR) 5 MG tablet, TAKE 1 TABLET BY MOUTH THREE  TIMES A DAY AS NEEDED, Disp: 90 tablet, Rfl: 0   naproxen sodium (ALEVE) 220 MG tablet, Take 440 mg by mouth as needed., Disp: , Rfl:    traZODone (DESYREL) 50 MG tablet, Take 1 tablet (50 mg total) by mouth at bedtime as needed for sleep., Disp: 90 tablet, Rfl: 3   valACYclovir (VALTREX) 1000 MG tablet, Take two tablets ( total 2000 mg) by mouth q12h x 1 day; Start: ASAP after symptom onset, Disp: 6 tablet, Rfl: 2   Multiple Vitamins-Minerals (ONE-A-DAY WOMENS PO), Take 1 tablet by mouth daily in the afternoon., Disp: , Rfl:    Review of Systems:   ROS Negative unless otherwise specified per HPI. Vitals:   Vitals:   11/22/21 1419  BP: 115/70  Pulse: 79  Temp: 98 F (36.7 C)  TempSrc: Temporal  SpO2: 99%  Weight: 236 lb 9.6 oz (107.3 kg)  Height: 5\' 5"  (1.651 m)     Body mass index is 39.37 kg/m.  Physical Exam:   Physical Exam Vitals and nursing note reviewed.  Constitutional:      General: She is not in acute distress.    Appearance: She is well-developed. She is not ill-appearing or toxic-appearing.  Cardiovascular:     Rate and Rhythm: Normal rate and regular rhythm.     Pulses: Normal pulses.  Heart sounds: Normal heart sounds, S1 normal and S2 normal.  Pulmonary:     Effort: Pulmonary effort is normal.     Breath sounds: Normal breath sounds.  Abdominal:     General: Abdomen is flat.     Tenderness: There is no abdominal tenderness.  Skin:    General: Skin is warm and dry.  Neurological:     Mental Status: She is alert.     GCS: GCS eye subscore is 4. GCS verbal subscore is 5. GCS motor subscore is 6.  Psychiatric:        Speech: Speech normal.        Behavior: Behavior normal. Behavior is cooperative.    Assessment and Plan:   Constipation Uncontrolled; no red flags or evidence of acute abdomen Start daily capful of miralax and 1-2 capsules of colace daily x 3 days Informed patient they may increase miralax two capfuls and continue 1-2 capsules of  colace if no improvement.  May continue colace daily and slowly decrease miralax upon having a regular bowel movement Encouraged patient to push more fluids, at least 64 oz of water daily and provided list of high fiber foods  Follow up if no improvement of symptoms occurs -- like restart low dose of linzess or refer back to GI  Need for immunization against influenza Completed today, patient tolerated well   I,Havlyn C Ratchford,acting as a scribe for [employer], PA.,have documented all relevant documentation on the behalf of Kelsey Coke, PA,as directed by  Kelsey Coke, PA while in the presence of Kelsey Curtis, [place].  I, Kelsey Curtis, [place], have reviewed all documentation for this visit. The documentation on 11/22/21 for the exam, diagnosis, procedures, and orders are all accurate and complete.   Kelsey Coke, PA-C

## 2021-11-24 ENCOUNTER — Ambulatory Visit: Payer: 59 | Admitting: Physician Assistant

## 2021-12-12 ENCOUNTER — Ambulatory Visit (INDEPENDENT_AMBULATORY_CARE_PROVIDER_SITE_OTHER): Payer: 59 | Admitting: Physician Assistant

## 2021-12-12 ENCOUNTER — Other Ambulatory Visit: Payer: Self-pay

## 2021-12-12 ENCOUNTER — Encounter: Payer: Self-pay | Admitting: Physician Assistant

## 2021-12-12 VITALS — BP 120/78 | HR 87 | Temp 97.9°F | Ht 65.0 in | Wt 240.2 lb

## 2021-12-12 DIAGNOSIS — R6882 Decreased libido: Secondary | ICD-10-CM

## 2021-12-12 DIAGNOSIS — A6004 Herpesviral vulvovaginitis: Secondary | ICD-10-CM

## 2021-12-12 MED ORDER — VALACYCLOVIR HCL 1 G PO TABS: ORAL_TABLET | ORAL | 1 refills | Status: AC

## 2021-12-12 NOTE — Patient Instructions (Signed)
It was great to see you!  Consider Addyi from gynecology  Consider talking more about interests, finding toys, etc  Consider joining him for talk therapy  Take care,  Inda Coke PA-C

## 2021-12-12 NOTE — Progress Notes (Signed)
Kelsey Curtis is a 31 y.o. female here for decreased libido.  History of Present Illness:   Chief Complaint  Patient presents with   Decreased Libido    Pt c/on issues x one year, getting worse.    Herpes outbreak    Pt is c/o herpes outbreak, sore right labia started on Sunday. Pt needs refill on Valtrex.    HPI  Decreased Libido Kelsey Curtis presents with c/o decreased libido that has been onset for a year but has been worsening recently. Pt admits that she doesn't notice her decrease in libido usually until her husband points it out. She does express that although she enjoys intercourse, she is rarely one to initiate it or even think about the subject. According to pt, she has noticed the difference in the beginning of her relationship with now husband versus currently. Kelsey Curtis is aware that anxiety and self image could contribute to this issue, but she more so finds herself feeling guilty that she can't fully satisfy her partner's desires as often.   In addition to decreased libido, she has also noticed that she is not as lubricated during the act. She is still able to have intercourse with no issues, but just thought this occurrence was slightly concerning. In the past she has talked about this issue with her gyn who in turn discussed the possibility of starting addyi, but at that time she wanted to explore more options before starting a medication. At this time, she says that she wanted to be evaluated today to make sure nothing is medically going on. Denies pain during intercourse, increased anxiety, increased fatigue, or abnormal vaginal discharge.    Herpes Simplex Outbreak Kelsey Curtis also presents with c/o a herpes outbreak that occurred 2 days prior to visit. Although she is compliant with taking valtrex 1000 mg daily, she noticed that her right labia became sore similar to the beginning of a herpes flare. Pt will need a refill of valtrex 1000 mg today.    Past Medical History:  Diagnosis  Date   Pyelonephritis      Social History   Tobacco Use   Smoking status: Never   Smokeless tobacco: Never  Vaping Use   Vaping Use: Never used  Substance Use Topics   Alcohol use: Yes    Comment: occ   Drug use: No    Past Surgical History:  Procedure Laterality Date   NO PAST SURGERIES      Family History  Problem Relation Age of Onset   Alzheimer's disease Maternal Grandmother    Colon cancer Neg Hx    Breast cancer Neg Hx     No Known Allergies  Current Medications:   Current Outpatient Medications:    APRI 0.15-30 MG-MCG tablet, , Disp: , Rfl:    busPIRone (BUSPAR) 5 MG tablet, TAKE 1 TABLET BY MOUTH THREE TIMES A DAY AS NEEDED, Disp: 270 tablet, Rfl: 1   naproxen sodium (ALEVE) 220 MG tablet, Take 440 mg by mouth as needed., Disp: , Rfl:    traZODone (DESYREL) 50 MG tablet, Take 1 tablet (50 mg total) by mouth at bedtime as needed for sleep., Disp: 90 tablet, Rfl: 3   valACYclovir (VALTREX) 1000 MG tablet, Take two tablets ( total 2000 mg) by mouth q12h x 1 day; Start: ASAP after symptom onset, Disp: 6 tablet, Rfl: 2   Review of Systems:   ROS Negative unless otherwise specified per HPI. Vitals:   Vitals:   12/12/21 1322  BP: 120/78  Pulse:  87  Temp: 97.9 F (36.6 C)  TempSrc: Temporal  SpO2: 98%  Weight: 240 lb 4 oz (109 kg)  Height: 5\' 5"  (1.651 m)     Body mass index is 39.98 kg/m.  Physical Exam:   Physical Exam Vitals and nursing note reviewed.  Constitutional:      General: She is not in acute distress.    Appearance: She is well-developed. She is not ill-appearing or toxic-appearing.  Cardiovascular:     Rate and Rhythm: Normal rate and regular rhythm.     Pulses: Normal pulses.     Heart sounds: Normal heart sounds, S1 normal and S2 normal.  Pulmonary:     Effort: Pulmonary effort is normal.     Breath sounds: Normal breath sounds.  Skin:    General: Skin is warm and dry.  Neurological:     Mental Status: She is alert.      GCS: GCS eye subscore is 4. GCS verbal subscore is 5. GCS motor subscore is 6.  Psychiatric:        Speech: Speech normal.        Behavior: Behavior normal. Behavior is cooperative.    Assessment and Plan:   Decreased Libido No red flags I personally do not think that she needs any diagnostic work-up Recommended patient participate in talk therapy with partner as well talk about more interests, devices, etc  Consider use of Addyi as provided by Gynecology   Herpes simplex vulvovaginitis Uncontrolled - not examined by me today Continue Valtrex 1000 mg twice daily  Follow up if symptoms worsen or concerns occur   I,Havlyn C Ratchford,acting as a scribe for , PA.,have documented all relevant documentation on the behalf of [employer], PA,as directed by  Jarold Motto, PA while in the presence of Jarold Motto, Jarold Motto.  I, [place], Jarold Motto, have reviewed all documentation for this visit. The documentation on 12/12/21 for the exam, diagnosis, procedures, and orders are all accurate and complete.   12/14/21, PA-C

## 2021-12-19 ENCOUNTER — Other Ambulatory Visit: Payer: Self-pay | Admitting: Physician Assistant

## 2022-03-14 ENCOUNTER — Encounter (HOSPITAL_BASED_OUTPATIENT_CLINIC_OR_DEPARTMENT_OTHER): Payer: Self-pay

## 2022-03-14 ENCOUNTER — Encounter: Payer: Self-pay | Admitting: Physician Assistant

## 2022-03-14 ENCOUNTER — Other Ambulatory Visit: Payer: Self-pay

## 2022-03-14 ENCOUNTER — Emergency Department (HOSPITAL_BASED_OUTPATIENT_CLINIC_OR_DEPARTMENT_OTHER)
Admission: EM | Admit: 2022-03-14 | Discharge: 2022-03-14 | Disposition: A | Discharge status: Home / Self Care | Payer: 59 | Attending: Emergency Medicine | Admitting: Emergency Medicine

## 2022-03-14 ENCOUNTER — Emergency Department (HOSPITAL_BASED_OUTPATIENT_CLINIC_OR_DEPARTMENT_OTHER): Payer: 59

## 2022-03-14 ENCOUNTER — Ambulatory Visit (INDEPENDENT_AMBULATORY_CARE_PROVIDER_SITE_OTHER): Payer: 59 | Admitting: Physician Assistant

## 2022-03-14 VITALS — BP 90/60 | HR 125 | Temp 98.4°F | Ht 65.0 in | Wt 235.0 lb

## 2022-03-14 DIAGNOSIS — N12 Tubulo-interstitial nephritis, not specified as acute or chronic: Secondary | ICD-10-CM | POA: Diagnosis not present

## 2022-03-14 DIAGNOSIS — R109 Unspecified abdominal pain: Secondary | ICD-10-CM | POA: Diagnosis present

## 2022-03-14 DIAGNOSIS — R000 Tachycardia, unspecified: Secondary | ICD-10-CM | POA: Insufficient documentation

## 2022-03-14 DIAGNOSIS — R509 Fever, unspecified: Secondary | ICD-10-CM

## 2022-03-14 LAB — CBC WITH DIFFERENTIAL/PLATELET
Abs Immature Granulocytes: 0.26 10*3/uL — ABNORMAL HIGH (ref 0.00–0.07)
Basophils Absolute: 0.1 10*3/uL (ref 0.0–0.1)
Basophils Relative: 0 %
Eosinophils Absolute: 0.6 10*3/uL — ABNORMAL HIGH (ref 0.0–0.5)
Eosinophils Relative: 3 %
HCT: 40 % (ref 36.0–46.0)
Hemoglobin: 13.2 g/dL (ref 12.0–15.0)
Immature Granulocytes: 1 %
Lymphocytes Relative: 7 %
Lymphs Abs: 1.5 10*3/uL (ref 0.7–4.0)
MCH: 27.7 pg (ref 26.0–34.0)
MCHC: 33 g/dL (ref 30.0–36.0)
MCV: 83.9 fL (ref 80.0–100.0)
Monocytes Absolute: 2.9 10*3/uL — ABNORMAL HIGH (ref 0.1–1.0)
Monocytes Relative: 14 %
Neutro Abs: 15.8 10*3/uL — ABNORMAL HIGH (ref 1.7–7.7)
Neutrophils Relative %: 75 %
Platelets: 251 10*3/uL (ref 150–400)
RBC: 4.77 MIL/uL (ref 3.87–5.11)
RDW: 12.5 % (ref 11.5–15.5)
WBC: 21.1 10*3/uL — ABNORMAL HIGH (ref 4.0–10.5)
nRBC: 0 % (ref 0.0–0.2)

## 2022-03-14 LAB — COMPREHENSIVE METABOLIC PANEL
ALT: 10 U/L (ref 0–44)
AST: 9 U/L — ABNORMAL LOW (ref 15–41)
Albumin: 3.7 g/dL (ref 3.5–5.0)
Alkaline Phosphatase: 68 U/L (ref 38–126)
Anion gap: 11 (ref 5–15)
BUN: 5 mg/dL — ABNORMAL LOW (ref 6–20)
CO2: 23 mmol/L (ref 22–32)
Calcium: 9.3 mg/dL (ref 8.9–10.3)
Chloride: 102 mmol/L (ref 98–111)
Creatinine, Ser: 0.78 mg/dL (ref 0.44–1.00)
GFR, Estimated: >60 mL/min (ref >60)
Glucose, Bld: 102 mg/dL — ABNORMAL HIGH (ref 70–99)
Potassium: 3.3 mmol/L — ABNORMAL LOW (ref 3.5–5.1)
Sodium: 136 mmol/L (ref 135–145)
Total Bilirubin: 0.9 mg/dL (ref 0.3–1.2)
Total Protein: 7.6 g/dL (ref 6.5–8.1)

## 2022-03-14 LAB — POCT URINALYSIS DIPSTICK
Bilirubin, UA: 1
Blood, UA: 1
Glucose, UA: NEGATIVE
Ketones, UA: NEGATIVE
Nitrite, UA: POSITIVE
Protein, UA: POSITIVE — AB
Spec Grav, UA: 1.015 (ref 1.010–1.025)
Urobilinogen, UA: 2 E.U./dL — AB
pH, UA: 6 (ref 5.0–8.0)

## 2022-03-14 LAB — URINALYSIS, ROUTINE W REFLEX MICROSCOPIC
Glucose, UA: NEGATIVE mg/dL
Ketones, ur: NEGATIVE mg/dL
Nitrite: POSITIVE — AB
Protein, ur: 100 mg/dL — AB
Specific Gravity, Urine: 1.02 (ref 1.005–1.030)
WBC, UA: >50 WBC/hpf — ABNORMAL HIGH (ref 0–5)
pH: 6 (ref 5.0–8.0)

## 2022-03-14 LAB — LACTIC ACID, PLASMA: Lactic Acid, Venous: 1.3 mmol/L (ref 0.5–1.9)

## 2022-03-14 LAB — PREGNANCY, URINE: Preg Test, Ur: NEGATIVE

## 2022-03-14 MED ORDER — ACETAMINOPHEN 325 MG PO TABS
650.0000 mg | ORAL_TABLET | Freq: Once | ORAL | Status: AC
  Administered 2022-03-14: 650 mg via ORAL
  Filled 2022-03-14: qty 2

## 2022-03-14 MED ORDER — SODIUM CHLORIDE 0.9 % IV BOLUS
1000.0000 mL | Freq: Once | INTRAVENOUS | Status: AC
  Administered 2022-03-14: 1000 mL via INTRAVENOUS

## 2022-03-14 MED ORDER — ONDANSETRON HCL 4 MG PO TABS: 4.0000 mg | ORAL_TABLET | Freq: Four times a day (QID) | ORAL | 0 refills | Status: DC

## 2022-03-14 MED ORDER — SODIUM CHLORIDE 0.9 % IV SOLN
1.0000 g | INTRAVENOUS | Status: DC
  Administered 2022-03-14: 1 g via INTRAVENOUS
  Filled 2022-03-14: qty 10

## 2022-03-14 MED ORDER — CEPHALEXIN 500 MG PO CAPS: 1000.0000 mg | ORAL_CAPSULE | Freq: Two times a day (BID) | ORAL | 0 refills | Status: DC

## 2022-03-14 NOTE — Addendum Note (Signed)
Addended by: Haynes Bast on: 03/14/2022 11:55 AM ? ? Modules accepted: Orders ? ?

## 2022-03-14 NOTE — ED Provider Notes (Signed)
?[facility] ?Provider Note ? ? ?CSN: [id number] ?Arrival date & time: 03/14/22  1155 ? ?  ? ?History ? ?Chief Complaint  ?Patient presents with  ? Fever  ? ? ?Kelsey Curtis is a 32 y.o. female 31 year old female with no significant past medical history who presents with 2 weeks of dysuria, foul-smelling urine, with worsening symptoms, stomach pain, nausea, malaise, fever, chills for last 5 days.  Patient reports that she thinks that she may have been exposed to some illness on the plane as she was recently in [employer] and was next to someone on the flight that she thought appeared ill.  Patient denies any shortness of breath, chest pain.  She does endorse a history of kidney stones but reports this overall feels different, although some of the pain is as severe.  She denies any hematuria, diarrhea, vomiting.  She has not taken any Motrin or Tylenol today. ? ? ?Fever ?Associated symptoms: dysuria and nausea   ? ?  ? ?Home Medications ?Prior to Admission medications   ?Medication Sig Start Date End Date Taking? Authorizing Provider  ?APRI 0.15-30 MG-MCG tablet  05/01/19  Yes [provider]  ?cephALEXin (KEFLEX) 500 MG capsule Take 2 capsules (1,000 mg total) by mouth 2 (two) times daily. 03/14/22  Yes Sharlot Sturkey H, PA-C  ?naproxen sodium (ALEVE) 220 MG tablet Take 440 mg by mouth as needed.   Yes [provider]  ?ondansetron (ZOFRAN) 4 MG tablet Take 1 tablet (4 mg total) by mouth every 6 (six) hours. 03/14/22  Yes Charletha Dalpe H, PA-C  ?busPIRone (BUSPAR) 5 MG tablet TAKE 1 TABLET BY MOUTH THREE TIMES A DAY AS NEEDED 11/22/21   Inda Coke, PA  ?traZODone (DESYREL) 50 MG tablet Take 1 tablet (50 mg total) by mouth at bedtime as needed for sleep. 02/27/21   Inda Coke, PA  ?valACYclovir (VALTREX) 1000 MG tablet Take two tablets ( total 2000 mg) by mouth q12h x 1 day; Start: ASAP after symptom onset 12/12/21   Inda Coke, Seltzer  ?   ? ?Allergies     ?Patient has no known allergies.   ? ?Review of Systems   ?Review of Systems  ?Constitutional:  Positive for fever.  ?Gastrointestinal:  Positive for abdominal pain and nausea.  ?Genitourinary:  Positive for dysuria and flank pain.  ?All other systems reviewed and are negative. ? ?Physical Exam ?Updated Vital Signs ?BP 114/74   Pulse 100   Temp 98.6 ?F (37 ?C)   Resp 16   Ht 5\' 5"  (1.651 m)   Wt 106.6 kg   LMP 02/22/2022 (Approximate)   SpO2 100%   BMI 39.11 kg/m?  ?Physical Exam ?Vitals and nursing note reviewed.  ?Constitutional:   ?   General: She is not in acute distress. ?   Appearance: Normal appearance. She is ill-appearing.  ?HENT:  ?   Head: Normocephalic and atraumatic.  ?Eyes:  ?   General:     ?   Right eye: No discharge.     ?   Left eye: No discharge.  ?Cardiovascular:  ?   Rate and Rhythm: Regular rhythm. Tachycardia present.  ?   Heart sounds: No murmur heard. ?  No friction rub. No gallop.  ?Pulmonary:  ?   Effort: Pulmonary effort is normal.  ?   Breath sounds: Normal breath sounds.  ?Abdominal:  ?   General: Bowel sounds are normal.  ?   Palpations: Abdomen is soft.  ?  Comments: Suprapubic tenderness to palpation, patient does have some flank pain bilaterally as well without positive CVA tenderness bilaterally.  She has no rebound, rigidity, guarding.  She has normal bowel sounds throughout.  ?Skin: ?   General: Skin is warm and dry.  ?   Capillary Refill: Capillary refill takes less than 2 seconds.  ?Neurological:  ?   Mental Status: She is alert and oriented to person, place, and time.  ?Psychiatric:     ?   Mood and Affect: Mood normal.     ?   Behavior: Behavior normal.  ? ? ?ED Results / Procedures / Treatments   ?Labs ?(all labs ordered are listed, but only abnormal results are displayed) ?Labs Reviewed  ?COMPREHENSIVE METABOLIC PANEL - Abnormal; Notable for the following components:  ?    Result Value  ? Potassium 3.3 (*)   ? Glucose, Bld 102 (*)   ? BUN 5 (*)   ? AST 9 (*)   ?  All other components within normal limits  ?CBC WITH DIFFERENTIAL/PLATELET - Abnormal; Notable for the following components:  ? WBC 21.1 (*)   ? Neutro Abs 15.8 (*)   ? Monocytes Absolute 2.9 (*)   ? Eosinophils Absolute 0.6 (*)   ? Abs Immature Granulocytes 0.26 (*)   ? All other components within normal limits  ?URINALYSIS, ROUTINE W REFLEX MICROSCOPIC - Abnormal; Notable for the following components:  ? APPearance HAZY (*)   ? Hgb urine dipstick SMALL (*)   ? Bilirubin Urine SMALL (*)   ? Protein, ur 100 (*)   ? Nitrite POSITIVE (*)   ? Leukocytes,Ua LARGE (*)   ? WBC, UA >50 (*)   ? Bacteria, UA MANY (*)   ? Non Squamous Epithelial 0-5 (*)   ? All other components within normal limits  ?URINE CULTURE  ?LACTIC ACID, PLASMA  ?PREGNANCY, URINE  ? ? ?EKG ?None ? ?Radiology ?CT Renal Stone Study ? ?Result Date: 03/14/2022 ?CLINICAL DATA:  Flank pain, kidney stones suspected EXAM: CT ABDOMEN AND PELVIS WITHOUT CONTRAST TECHNIQUE: Multidetector CT imaging of the abdomen and pelvis was performed following the standard protocol without IV contrast. RADIATION DOSE REDUCTION: This exam was performed according to the departmental dose-optimization program which includes automated exposure control, adjustment of the mA and/or kV according to patient size and/or use of iterative reconstruction technique. COMPARISON:  None. FINDINGS: Lower chest: No acute abnormality. Hepatobiliary: Normal hepatic contour and morphology. No discrete hepatic lesion. Multiple gallstones are present within the gallbladder lumen. Several of the stones contain internal gas likely reflecting nitrogen. Pancreas: Unremarkable. No pancreatic ductal dilatation or surrounding inflammatory changes. Spleen: Normal in size without focal abnormality. Adrenals/Urinary Tract: Normal adrenal glands. 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 There is compensatory hypertrophy of the left kidney. No evidence of hydronephrosis. Possible tiny 1 mm  stone in the interpolar collecting system. Unremarkable ureter and bladder. No stones visualized. Stomach/Bowel: No evidence of obstruction or focal bowel wall thickening. Normal appendix in the right lower quadrant. The terminal ileum is unremarkable. Vascular/Lymphatic: Limited evaluation in the absence of intravenous contrast. No atherosclerotic plaque or aneurysm. No suspicious lymphadenopathy. Reproductive: Uterus and bilateral adnexa are unremarkable. Other: No abdominal wall hernia or abnormality. No abdominopelvic ascites. Musculoskeletal: No acute or significant osseous findings. IMPRESSION: 1. 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 2. Compensatory hypertrophy of the left kidney with a possible tiny 1 mm stone in the interpolar collecting system. No evidence of hydronephrosis. 3. Cholelithiasis. Electronically  Signed   By: Jacqulynn Cadet M.D.   On: 03/14/2022 13:26   ? ?Procedures ?Procedures  ? ? ?Medications Ordered in ED ?Medications  ?cefTRIAXone (ROCEPHIN) 1 g in sodium chloride 0.9 % 100 mL IVPB (0 g Intravenous Stopped 03/14/22 1411)  ?sodium chloride 0.9 % bolus 1,000 mL (0 mLs Intravenous Stopped 03/14/22 1409)  ?acetaminophen (TYLENOL) tablet 650 mg (650 mg Oral Given 03/14/22 1305)  ?sodium chloride 0.9 % bolus 1,000 mL (1,000 mLs Intravenous New Bag/Given 03/14/22 1409)  ? ? ?ED Course/ Medical Decision Making/ A&P ?  ?                        ?Medical Decision Making ?Amount and/or Complexity of Data Reviewed ?Labs: ordered. ?Radiology: ordered. ? ? ?This patient presents to the ED for concern of dysuria, flank pain, fever, chills, this involves an extensive number of treatment options, and is a complaint that carries with it a high risk of complications and morbidity. The emergent differential diagnosis prior to evaluation includes, but is not limited to,, pyelonephritis, nephrolithiasis, infected nephrolithiasis, sepsis, bacteremia, diverticulitis, appendicitis, STI, tubo-ovarian abscess, versus  other.  ? ?This is not an exhaustive differential.  ? ?Past Medical History / Co-morbidities / Social History: ?Previous history of UTIs, nephrolithiasis ? ?Additional history: ?Chart reviewed. Pertinent results i

## 2022-03-14 NOTE — ED Triage Notes (Signed)
Patient here POV from Home. ? ?Endorses Fever Intermittently associated with Chills and Headache for approximately a few days. Endorses Dysuria for approximately 1 week. ? ?Seen by PCP today and had concerns for Pyelonephritis and Sepsis related to Same. ? ?No N/V/D. Fever Last PM of 102. ? ?NAD Noted during Triage. A&Ox4. GCS 15. Ambulatory. ?

## 2022-03-14 NOTE — Patient Instructions (Signed)
It was great to see you! ? ?I am concerned that you are becoming septic from your kidney infection. ?You need to go to the ER. ? ?Please go to [facility] ?Address: [address], [place], [place] [address] ?Phone: [phone] ? ?Take care, ? ?Jarold Motto PA-C  ?

## 2022-03-14 NOTE — Discharge Instructions (Addendum)
Please drink plenty of fluids, take tylenol, motrin as needed for fever, pain. Please take the entire course of antibiotics I am prescribing.  If your symptoms worsen instead of improving including fever that does not respond to Motrin, Tylenol, worsening pain, fever, chills, nausea, vomiting I recommend that you return for further evaluation.  As we discussed I do not think that it is crucial that you follow-up with urology immediately, however given the incidental ectopic kidney finding which she reports is new for you, history of kidney infections, and kidney stones may be worthwhile to touch base with urology in the future if this continues to become a recurrent problem.  As we discussed at this time your kidney function is normal, I do not have any acute concerns about this placement of your kidney, which as we discussed can be a normal anatomic variant. ?

## 2022-03-14 NOTE — Progress Notes (Signed)
Kelsey Curtis is a 31 y.o. female here for a fever. ? ?History of Present Illness:  ? ?Chief Complaint  ?Patient presents with  ? Fever  ?  Pt states symptoms started on Friday with fever 102, chills, nausea, foul smelling urine. Body aches. COVID test on Monday was Negative.  ? ? ?HPI ? ?Fever ?Kelsey Curtis presents with c/o fever of 102 that started on 03/09/22. States that prior to her first fever, she was heading back home from [facility] via plane and was sitting next to someone who she believes was sick. States that he felt warm when she accidentally touched him and her sx started shortly after while she was sitting in the airport due to flight delays. According to her she had a fever of 102 everyday from that point that would break after a couple of hours before returning the next day.  As time went on she began experiencing nausea, chills, body aches/back pain, and foul smelling urine. Upon further discussion, she admitted that she had been experiencing her foul smelling urine for about 2 weeks, but thought this was the [facility] that she stopped taking around this time running out of her system.  ? ?Currently she rates her back pain to be about a 3 out of 10, but attributes this to laying down for long periods of time over the past 3 days. Due to sx, she did taken an at home covid test on 03/12/22, but this resulted as negative. As a result of ongoing sx and no improvement upon she decided to have this further evaluated. Pt has a hx of pyelonephritis.  ? ? ?Past Medical History:  ?Diagnosis Date  ? Pyelonephritis   ? ?  ?Social History  ? ?Tobacco Use  ? Smoking status: Never  ? Smokeless tobacco: Never  ?Vaping Use  ? Vaping Use: Never used  ?Substance Use Topics  ? Alcohol use: Yes  ?  Comment: occ  ? Drug use: No  ? ? ?Past Surgical History:  ?Procedure Laterality Date  ? NO PAST SURGERIES    ? ? ?Family History  ?Problem Relation Age of Onset  ? Alzheimer's disease Maternal Grandmother   ? Colon cancer Neg Hx   ?  Breast cancer Neg Hx   ? ? ?No Known Allergies ? ?Current Medications:  ? ?Current Outpatient Medications:  ?  APRI 0.15-30 MG-MCG tablet, , Disp: , Rfl:  ?  busPIRone (BUSPAR) 5 MG tablet, TAKE 1 TABLET BY MOUTH THREE TIMES A DAY AS NEEDED, Disp: 270 tablet, Rfl: 1 ?  naproxen sodium (ALEVE) 220 MG tablet, Take 440 mg by mouth as needed., Disp: , Rfl:  ?  traZODone (DESYREL) 50 MG tablet, Take 1 tablet (50 mg total) by mouth at bedtime as needed for sleep., Disp: 90 tablet, Rfl: 3 ?  valACYclovir (VALTREX) 1000 MG tablet, Take two tablets ( total 2000 mg) by mouth q12h x 1 day; Start: ASAP after symptom onset, Disp: 30 tablet, Rfl: 1  ? ?Review of Systems:  ? ?ROS ?Negative unless otherwise specified per HPI. ?Vitals:  ? ?Vitals:  ? 03/14/22 1107  ?BP: 90/60  ?Pulse: (!) 125  ?Temp: 98.4 ?F (36.9 ?C)  ?TempSrc: Temporal  ?SpO2: 94%  ?Weight: 235 lb (106.6 kg)  ?Height: 5\' 5"  (1.651 m)  ?   ?Body mass index is 39.11 kg/m?. ? ?Physical Exam:  ? ?Physical Exam ?Vitals and nursing note reviewed.  ?Constitutional:   ?   General: She is not in acute distress. ?  Appearance: She is well-developed. She is ill-appearing. She is not toxic-appearing.  ?Cardiovascular:  ?   Rate and Rhythm: Normal rate and regular rhythm.  ?   Pulses: Normal pulses.  ?   Heart sounds: Normal heart sounds, S1 normal and S2 normal.  ?Pulmonary:  ?   Effort: Pulmonary effort is normal.  ?   Breath sounds: Normal breath sounds.  ?Abdominal:  ?   Tenderness: There is right CVA tenderness and left CVA tenderness.  ?Skin: ?   General: Skin is warm and dry.  ?Neurological:  ?   Mental Status: She is alert.  ?   GCS: GCS eye subscore is 4. GCS verbal subscore is 5. GCS motor subscore is 6.  ?Psychiatric:     ?   Speech: Speech normal.     ?   Behavior: Behavior normal. Behavior is cooperative.  ? ?Results for orders placed or performed in visit on 03/14/22  ?POCT urinalysis dipstick  ?Result Value Ref Range  ? Color, UA dark amber   ? Clarity, UA  cloudy   ? Glucose, UA Negative Negative  ? Bilirubin, UA 1   ? Ketones, UA Negative   ? Spec Grav, UA 1.015 1.010 - 1.025  ? Blood, UA 1   ? pH, UA 6.0 5.0 - 8.0  ? Protein, UA Positive (A) Negative  ? Urobilinogen, UA 2.0 (A) 0.2 or 1.0 E.U./dL  ? Nitrite, UA Positive   ? Leukocytes, UA Large (3+) (A) Negative  ? Appearance    ? Odor Yes   ? ? ? ?Assessment and Plan:  ? ?Pyelonephritis; Fever and chills ?New ?Concern for possible early sepsis ?Recommend ER evaluation ASAP ?She is agreeable and proceeded to [facility] ? ?I,Havlyn C Ratchford,acting as a scribe for [employer], PA.,have documented all relevant documentation on the behalf of Jarold Motto, PA,as directed by  Jarold Motto, PA while in the presence of Jarold Motto, [place]. ? ?IJarold Motto, PA, have reviewed all documentation for this visit. The documentation on 03/14/22 for the exam, diagnosis, procedures, and orders are all accurate and complete. ? ?Jarold Motto, PA-C ? ?

## 2022-03-16 LAB — URINE CULTURE
Culture: >=100000 — AB
MICRO NUMBER:: 13315025
SPECIMEN QUALITY:: ADEQUATE

## 2022-03-17 ENCOUNTER — Telehealth (HOSPITAL_BASED_OUTPATIENT_CLINIC_OR_DEPARTMENT_OTHER): Payer: Self-pay | Admitting: *Deleted

## 2022-03-17 NOTE — Telephone Encounter (Signed)
Post ED Visit - Positive Culture Follow-up ? ?Culture report reviewed by antimicrobial stewardship pharmacist: ?[facility] ?[]  , Enzo Bi.D. ?[]  [address], Pharm.D., BCPS AQ-ID ?[]  , Pharm.D., BCPS ?[]  Celedonio Miyamoto, Pharm.D., BCPS ?[]  [place]arks, Garvin Fila.D., BCPS, AAHIVP ?[]  , Pharm.D., BCPS, AAHIVP ?[]  [place]ina Pillion, PharmD, BCPS ?[]  , PharmD, BCPS ?[]  [place], PharmD, BCPS ?[]  [address], PharmD ?[]  , PharmD, BCPS ?[x]  Estella Husk, PharmD ? ? [facility] ?[]  Lysle Pearl, PharmD ?[]  , PharmD ?[]  Phillips Climes, PharmD ?[]  , Rph ?[]  Agapito Games) , PharmD ?[]  Verlan Friends, PharmD ?[]  , PharmD ?[]  Mervyn Gay, PharmD ?[]  , PharmD ?[]  Vinnie Level, PharmD ?[]  Gerri Spore, PharmD ?[]  , PharmD ?[]  Len Childs, PharmD ? ? ?Positive urine culture ?Treated with Cephalexin, organism sensitive to the same and no further patient follow-up is required at this time. ? ? ?03/17/2022, 12:08 PM ?  ?

## 2022-03-21 ENCOUNTER — Encounter: Payer: Self-pay | Admitting: Physician Assistant

## 2022-03-27 ENCOUNTER — Telehealth: Payer: Self-pay | Admitting: Physician Assistant

## 2022-03-27 NOTE — Telephone Encounter (Signed)
Kelsey Curtis, please find out what she needs FMLA for? When she was out from work and went back? ?

## 2022-03-27 NOTE — Telephone Encounter (Signed)
..  Type of form received: FMLA ? ?Additional comments:  ? ?Received by: Adonis Brook ? ?Form should be Faxed to: ? ?Form should be mailed to:  FMLA Specialist ? ?Is patient requesting call for pickup: ? ? ?Form placed:  In provider's box  ? ?Attach charge sheet. Yes ? ?Individual made aware of 3-5 business day turn around (Y/N)? Y ?  ?

## 2022-03-28 NOTE — Telephone Encounter (Signed)
Patient scheduled.

## 2022-03-29 ENCOUNTER — Telehealth (INDEPENDENT_AMBULATORY_CARE_PROVIDER_SITE_OTHER): Payer: 59 | Admitting: Physician Assistant

## 2022-03-29 ENCOUNTER — Encounter: Payer: Self-pay | Admitting: Physician Assistant

## 2022-03-29 VITALS — Ht 65.0 in | Wt 235.0 lb

## 2022-03-29 DIAGNOSIS — F419 Anxiety disorder, unspecified: Secondary | ICD-10-CM | POA: Diagnosis not present

## 2022-03-29 NOTE — Progress Notes (Signed)
? ? ?  I acted as a Neurosurgeon for [employer], PA-C ?Corky Mull, LPN ? ?Virtual Visit via Video Note  ? ?I, , connected with  Kelsey Curtis  ([id number], 11-Sep-1991) on 03/29/22 at  8:00 AM EDT by a video-enabled telemedicine application and verified that I am speaking with the correct person using two identifiers. ? ?Location: ?Patient: Home ?Provider: [facility] [place] [facility] office ?  ?I discussed the limitations of evaluation and management by telemedicine and the availability of in person appointments. The patient expressed understanding and agreed to proceed.   ? ?History of Present Illness: ?[name] is a 31 y.o. who identifies as a female who was assigned female at birth, and is being seen today for FMLA paper work for anxiety. Pt has been using FMLA for the past 3 years. Has ongoing anxiety from COVID. She is overall doing well. She does have intermittent panic attacks, but she uses bupsar 5 mg TID to help with this. Her FMLA has been helping her manage her symptoms well and she would like to continue this. ? ?Denies SI/HI. ? ?Problems:  ?Patient Active Problem List  ? Diagnosis Date Noted  ? Anxiety 07/11/2021  ? 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  ?Allergies: No Known Allergies ?Medications:  ?Current Outpatient Medications:  ?  APRI 0.15-30 MG-MCG tablet, , Disp: , Rfl:  ?  busPIRone (BUSPAR) 5 MG tablet, TAKE 1 TABLET BY MOUTH THREE TIMES A DAY AS NEEDED, Disp: 270 tablet, Rfl: 1 ?  naproxen sodium (ALEVE) 220 MG tablet, Take 440 mg by mouth as needed., Disp: , Rfl:  ?  traZODone (DESYREL) 50 MG tablet, Take 1 tablet (50 mg total) by mouth at bedtime as needed for sleep., Disp: 90 tablet, Rfl: 3 ?  valACYclovir (VALTREX) 1000 MG tablet, Take two tablets ( total 2000 mg) by mouth q12h x 1 day; Start: ASAP after symptom onset, Disp: 30 tablet, Rfl: 1 ? ?Observations/Objective: ?Patient is well-developed, well-nourished in no acute distress.  ?Resting comfortably  at home.  ?Head is normocephalic,  atraumatic.  ?No labored breathing.  ?Speech is clear and coherent with logical content.  ?Patient is alert and oriented at baseline.  ? ? ?Assessment and Plan: ?1. Anxiety ?Well controlled ?Continue FMLA forms - we reviewed 2021 forms and will continue current allowance of 1 day per q 2 weeks for anxiety ?Follow-up in 3-6 months, sooner if concerns ?Continue buspar 5 mg TID prn ?I discussed with patient that if they develop any SI, to tell someone immediately and seek medical attention. ? ? ?Follow Up Instructions: ?I discussed the assessment and treatment plan with the patient. The patient was provided an opportunity to ask questions and all were answered. The patient agreed with the plan and demonstrated an understanding of the instructions.  A copy of instructions were sent to the patient via MyChart unless otherwise noted below.  ? ?The patient was advised to call back or seek an in-person evaluation if the symptoms worsen or if the condition fails to improve as anticipated. ? ?Jarold Motto, PA ?

## 2022-04-02 NOTE — Telephone Encounter (Signed)
Spoke to pt told her FMLA paperwork is completed. I will put in the mail and will go out tomorrow. I made a copy for your chart. Pt verbalized understanding. ?

## 2022-08-01 ENCOUNTER — Encounter: Payer: Self-pay | Admitting: Physician Assistant

## 2022-08-01 ENCOUNTER — Ambulatory Visit (INDEPENDENT_AMBULATORY_CARE_PROVIDER_SITE_OTHER): Payer: 59 | Admitting: Physician Assistant

## 2022-08-01 VITALS — BP 120/80 | HR 72 | Temp 97.6°F | Ht 65.0 in | Wt 233.2 lb

## 2022-08-01 DIAGNOSIS — Z23 Encounter for immunization: Secondary | ICD-10-CM | POA: Diagnosis not present

## 2022-08-01 DIAGNOSIS — E785 Hyperlipidemia, unspecified: Secondary | ICD-10-CM

## 2022-08-01 DIAGNOSIS — F5101 Primary insomnia: Secondary | ICD-10-CM

## 2022-08-01 LAB — LIPID PANEL
Cholesterol: 236 mg/dL — ABNORMAL HIGH (ref 0–200)
HDL: 66.4 mg/dL (ref >39.00)
LDL Cholesterol: 149 mg/dL — ABNORMAL HIGH (ref 0–99)
NonHDL: 169.55
Total CHOL/HDL Ratio: 4
Triglycerides: 102 mg/dL (ref 0.0–149.0)
VLDL: 20.4 mg/dL (ref 0.0–40.0)

## 2022-08-01 MED ORDER — TRAZODONE HCL 50 MG PO TABS: 50.0000 mg | ORAL_TABLET | Freq: Every evening | ORAL | 3 refills | Status: DC | PRN

## 2022-08-01 NOTE — Patient Instructions (Signed)
It was great to see you!  We will check your cholesterol.  Take care,  Jarold Motto PA-C

## 2022-08-01 NOTE — Progress Notes (Signed)
Kelsey Curtis is a 31 y.o. female here for a follow up of a pre-existing problem.  History of Present Illness:   Chief Complaint  Patient presents with   f/u cholesterol    Pt had test done for life insurance and said cholesterol elevated. Pt would like it rechecked.    HPI  HLD Had blood work for [employer] and had elevated cholesterol. She wants this rechecked today. Mom and dad both have elevated cholesterol levels.  Insomnia  Needs a refill on her trazodone. Currently not sleeping well due to stress. She recently moved. Has not taken her trazodone 50 mg consistently. Doesn't take buspar. Denies SI/HI.   Past Medical History:  Diagnosis Date   Pyelonephritis      Social History   Tobacco Use   Smoking status: Never   Smokeless tobacco: Never  Vaping Use   Vaping Use: Never used  Substance Use Topics   Alcohol use: Yes    Comment: occ   Drug use: No    Past Surgical History:  Procedure Laterality Date   NO PAST SURGERIES      Family History  Problem Relation Age of Onset   Alzheimer's disease Maternal Grandmother    Colon cancer Neg Hx    Breast cancer Neg Hx     No Known Allergies  Current Medications:   Current Outpatient Medications:    APRI 0.15-30 MG-MCG tablet, , Disp: , Rfl:    busPIRone (BUSPAR) 5 MG tablet, TAKE 1 TABLET BY MOUTH THREE TIMES A DAY AS NEEDED, Disp: 270 tablet, Rfl: 1   Cyanocobalamin (VITAMIN B 12) 500 MCG TABS, Take 1 tablet by mouth daily in the afternoon., Disp: , Rfl:    naproxen sodium (ALEVE) 220 MG tablet, Take 440 mg by mouth as needed., Disp: , Rfl:    traZODone (DESYREL) 50 MG tablet, Take 1 tablet (50 mg total) by mouth at bedtime as needed for sleep., Disp: 90 tablet, Rfl: 3   valACYclovir (VALTREX) 1000 MG tablet, Take two tablets ( total 2000 mg) by mouth q12h x 1 day; Start: ASAP after symptom onset, Disp: 30 tablet, Rfl: 1   Vitamin D, Cholecalciferol, 25 MCG (1000 UT) CAPS, Take 1 capsule by mouth  daily in the afternoon., Disp: , Rfl:    Review of Systems:   ROS Negative unless otherwise specified per HPI.  Vitals:   Vitals:   08/01/22 1035  BP: 120/80  Pulse: 72  Temp: 97.6 F (36.4 C)  TempSrc: Temporal  Weight: 233 lb 4 oz (105.8 kg)  Height: 5\' 5"  (1.651 m)     Body mass index is 38.81 kg/m.  Physical Exam:   Physical Exam Vitals and nursing note reviewed.  Constitutional:      General: She is not in acute distress.    Appearance: She is well-developed. She is not ill-appearing or toxic-appearing.  Cardiovascular:     Rate and Rhythm: Normal rate and regular rhythm.     Pulses: Normal pulses.     Heart sounds: Normal heart sounds, S1 normal and S2 normal.  Pulmonary:     Effort: Pulmonary effort is normal.     Breath sounds: Normal breath sounds.  Skin:    General: Skin is warm and dry.  Neurological:     Mental Status: She is alert.     GCS: GCS eye subscore is 4. GCS verbal subscore is 5. GCS motor subscore is 6.  Psychiatric:        Speech:  Speech normal.        Behavior: Behavior normal. Behavior is cooperative.     Assessment and Plan:   Hyperlipidemia, unspecified hyperlipidemia type Update lipid panel today and provide recommendations accordingly  Primary insomnia Uncontrolled Restart trazodone 50 mg consistently, consider dose increase if ineffective  Follow-up in 6 months, sooner if concerns  Jarold Motto, PA-C

## 2022-08-07 ENCOUNTER — Encounter: Payer: Self-pay | Admitting: Nurse Practitioner

## 2022-08-07 ENCOUNTER — Ambulatory Visit (INDEPENDENT_AMBULATORY_CARE_PROVIDER_SITE_OTHER): Payer: 59 | Admitting: Nurse Practitioner

## 2022-08-07 VITALS — BP 108/62 | HR 60 | Resp 16 | Ht 64.0 in | Wt 235.0 lb

## 2022-08-07 DIAGNOSIS — Z3041 Encounter for surveillance of contraceptive pills: Secondary | ICD-10-CM | POA: Diagnosis not present

## 2022-08-07 DIAGNOSIS — F520 Hypoactive sexual desire disorder: Secondary | ICD-10-CM | POA: Diagnosis not present

## 2022-08-07 DIAGNOSIS — Z01419 Encounter for gynecological examination (general) (routine) without abnormal findings: Secondary | ICD-10-CM

## 2022-08-07 MED ORDER — APRI 0.15-30 MG-MCG PO TABS: 1.0000 | ORAL_TABLET | Freq: Every day | ORAL | 3 refills | Status: DC

## 2022-08-07 NOTE — Progress Notes (Signed)
   Kelsey Curtis Mar 13, 1991 [id number]   History:  31 y.o. G0 presents as new patient to establish care. Monthly cycles. Normal pap history. Complains of low libido. She reports no initiative but does find pleasure with intercourse and does orgasm. She has considered Addyi but is unsure if she wants to start.   Gynecologic History Patient's last menstrual period was 08/03/2022 (approximate). Period Cycle (Days): 28 Period Duration (Days): 3-4 Period Pattern: Regular Menstrual Flow: Light Menstrual Control: Maxi pad, Panty liner Menstrual Control Change Freq (Hours): changes pad every 4 hours Dysmenorrhea: None Contraception/Family planning: OCP (estrogen/progesterone) Sexually active: Yes  Health Maintenance Last Pap: 03/2021 per patient. Results were: Normal Last mammogram: 02/2021 per patient. Results were: Normal Last colonoscopy: Not indicated Last Dexa: Not indicated  Past medical history, past surgical history, family history and social history were all reviewed and documented in the EPIC chart. Married. Works for post [employer]. Has 26 yo stepson.   ROS:  A ROS was performed and pertinent positives and negatives are included.  Exam:  Vitals:   08/07/22 1328  BP: 108/62  Pulse: 60  Resp: 16  Weight: 235 lb (106.6 kg)  Height: 5\' 4"  (1.626 m)   Body mass index is 40.34 kg/m.  General appearance:  Normal Thyroid:  Symmetrical, normal in size, without palpable masses or nodularity. Respiratory  Auscultation:  Clear without wheezing or rhonchi Cardiovascular  Auscultation:  Regular rate, without rubs, murmurs or gallops  Edema/varicosities:  Not grossly evident Abdominal  Soft,nontender, without masses, guarding or rebound.  Liver/spleen:  No organomegaly noted  Hernia:  None appreciated  Skin  Inspection:  Grossly normal Breasts: Examined lying and sitting.   Right: Without masses, retractions, nipple discharge or axillary adenopathy.   Left: Without masses,  retractions, nipple discharge or axillary adenopathy. Genitourinary   Inguinal/mons:  Normal without inguinal adenopathy  External genitalia:  Normal appearing vulva with no masses, tenderness, or lesions  BUS/Urethra/Skene's glands:  Normal  Vagina:  Normal appearing with normal color and discharge, no lesions  Cervix:  Normal appearing without discharge or lesions  Uterus:  Normal in size, shape and contour.  Midline and mobile, nontender  Adnexa/parametria:     Rt: Normal in size, without masses or tenderness.   Lt: Normal in size, without masses or tenderness.  Anus and perineum: Normal  Digital rectal exam: Not indicated  Patient informed chaperone available to be present for breast and pelvic exam. Patient has requested no chaperone to be present. Patient has been advised what will be completed during breast and pelvic exam.   Assessment/Plan:  31 y.o. G0 to establish care.   Well female exam with routine gynecological exam - Education provided on SBEs, importance of preventative screenings, current guidelines, high calcium diet, regular exercise, and multivitamin daily.  Labs with PCP.   Encounter for surveillance of contraceptive pills - Plan: APRI 0.15-30 MG-MCG tablet daily. Takes as prescribed. Refill x 1 year provided.   Hypoactive sexual desire disorder - Complains of low libido. She reports no initiative but does find pleasure with intercourse and does orgasm. This is not new for her. She has considered Addyi but is unsure if she wants to start. We discussed Addyi use and risks. She will reach out if she decides to pursue.  Screening for cervical cancer - Normal Pap history.  Will repeat at 3-year interval per guidelines..   Return in 1 year for annual.      Tamela Gammon DNP, 1:39 PM 08/07/2022

## 2022-08-09 ENCOUNTER — Encounter: Payer: Self-pay | Admitting: Physician Assistant

## 2022-08-10 ENCOUNTER — Ambulatory Visit (INDEPENDENT_AMBULATORY_CARE_PROVIDER_SITE_OTHER): Payer: 59 | Admitting: Family

## 2022-08-10 ENCOUNTER — Encounter: Payer: Self-pay | Admitting: Family

## 2022-08-10 VITALS — BP 117/78 | HR 74 | Temp 97.3°F | Ht 64.0 in | Wt 237.8 lb

## 2022-08-10 DIAGNOSIS — N3090 Cystitis, unspecified without hematuria: Secondary | ICD-10-CM

## 2022-08-10 LAB — POCT URINALYSIS DIPSTICK
Bilirubin, UA: NEGATIVE
Blood, UA: POSITIVE
Glucose, UA: NEGATIVE
Ketones, UA: NEGATIVE
Nitrite, UA: POSITIVE
Protein, UA: NEGATIVE
Spec Grav, UA: 1.015 (ref 1.010–1.025)
Urobilinogen, UA: 0.2 E.U./dL
pH, UA: 7 (ref 5.0–8.0)

## 2022-08-10 MED ORDER — NITROFURANTOIN MONOHYD MACRO 100 MG PO CAPS
100.0000 mg | ORAL_CAPSULE | Freq: Two times a day (BID) | ORAL | 0 refills | Status: DC
Start: 2022-08-10 — End: 2022-08-10

## 2022-08-10 NOTE — Progress Notes (Signed)
Patient ID: Kelsey Curtis, female    DOB: [birth year], 31 y.o.   MRN: [id number]  Chief Complaint  Patient presents with  . Urinary Frequency    Pt c/o urinary frequency, Lower back pain, dysuria and odor. Present 2 days, Has not tried any medications for symptoms.     HPI:      Urinary symptoms: Patient c/o  dysuria, frequency, urgency,  low back pain, foul odor, . Denies: flank pain, pelvic pain, cloudy urine, hematuria. Other sx: vaginal d/c: none, vaginal itching: none. Duration of sx: 2 days; Home tx: none; Denies  nausea, fever. Reports last UTI 02/2022 - UTI that developed into kidney infection requiring a hospital stay. Reports she is under urology care.     Assessment & Plan:  1. Cystitis UA positive, sending Macrobid, advised on use & SE, drinking 2L water qd, will notify when urine culture is back, continue to follow with Urology.  - POCT Urinalysis Dipstick - Urine Culture - nitrofurantoin, macrocrystal-monohydrate, (MACROBID) 100 MG capsule; Take 1 capsule (100 mg total) by mouth 2 (two) times daily.  Dispense: 14 capsule; Refill: 0   Subjective:    Outpatient Medications Prior to Visit  Medication Sig Dispense Refill  . APRI 0.15-30 MG-MCG tablet Take 1 tablet by mouth daily. 84 tablet 3  . busPIRone (BUSPAR) 5 MG tablet TAKE 1 TABLET BY MOUTH THREE TIMES A DAY AS NEEDED 270 tablet 1  . Cyanocobalamin (VITAMIN B 12) 500 MCG TABS Take 1 tablet by mouth daily in the afternoon.    . naproxen sodium (ALEVE) 220 MG tablet Take 440 mg by mouth as needed.    . traZODone (DESYREL) 50 MG tablet Take 1 tablet (50 mg total) by mouth at bedtime as needed for sleep. 90 tablet 3  . valACYclovir (VALTREX) 1000 MG tablet Take two tablets ( total 2000 mg) by mouth q12h x 1 day; Start: ASAP after symptom onset 30 tablet 1  . Vitamin D, Cholecalciferol, 25 MCG (1000 UT) CAPS Take 1 capsule by mouth daily in the afternoon.     No facility-administered medications prior to visit.   Past  Medical History:  Diagnosis Date  . Pyelonephritis    Past Surgical History:  Procedure Laterality Date  . NO PAST SURGERIES     No Known Allergies    Objective:    Physical Exam Vitals and nursing note reviewed.  Constitutional:      Appearance: Normal appearance.  Cardiovascular:     Rate and Rhythm: Normal rate and regular rhythm.  Pulmonary:     Effort: Pulmonary effort is normal.     Breath sounds: Normal breath sounds.  Musculoskeletal:        General: Normal range of motion.  Skin:    General: Skin is warm and dry.  Neurological:     Mental Status: She is alert.  Psychiatric:        Mood and Affect: Mood normal.        Behavior: Behavior normal.   BP 117/78 (BP Location: Left Arm, Patient Position: Sitting, Cuff Size: Large)   Pulse 74   Temp (!) 97.3 F (36.3 C) (Temporal)   Ht 5\' 4"  (1.626 m)   Wt 237 lb 12.8 oz (107.9 kg)   LMP 08/03/2022 (Exact Date)   SpO2 99%   BMI 40.82 kg/m  Wt Readings from Last 3 Encounters:  08/10/22 237 lb 12.8 oz (107.9 kg)  08/07/22 235 lb (106.6 kg)  08/01/22 233 lb 4 oz (  105.8 kg)       Dulce Sellar, NP

## 2022-08-13 LAB — URINE CULTURE
MICRO NUMBER:: 13956004
SPECIMEN QUALITY:: ADEQUATE

## 2022-08-13 NOTE — Progress Notes (Signed)
Your urine culture indicates your infection should resolve with the [place] I prescribed.  I hope you are feeling better!

## 2022-08-27 ENCOUNTER — Encounter: Payer: Self-pay | Admitting: Physician Assistant

## 2022-08-27 ENCOUNTER — Ambulatory Visit (INDEPENDENT_AMBULATORY_CARE_PROVIDER_SITE_OTHER): Payer: 59 | Admitting: Physician Assistant

## 2022-08-27 VITALS — BP 122/80 | HR 76 | Temp 98.6°F | Ht 64.0 in | Wt 230.5 lb

## 2022-08-27 DIAGNOSIS — R1030 Lower abdominal pain, unspecified: Secondary | ICD-10-CM

## 2022-08-27 LAB — COMPREHENSIVE METABOLIC PANEL
ALT: 19 U/L (ref 0–35)
AST: 15 U/L (ref 0–37)
Albumin: 3.9 g/dL (ref 3.5–5.2)
Alkaline Phosphatase: 84 U/L (ref 39–117)
BUN: 11 mg/dL (ref 6–23)
CO2: 27 mEq/L (ref 19–32)
Calcium: 8.9 mg/dL (ref 8.4–10.5)
Chloride: 103 mEq/L (ref 96–112)
Creatinine, Ser: 0.88 mg/dL (ref 0.40–1.20)
GFR: 87.03 mL/min (ref >60.00)
Glucose, Bld: 87 mg/dL (ref 70–99)
Potassium: 4.2 mEq/L (ref 3.5–5.1)
Sodium: 137 mEq/L (ref 135–145)
Total Bilirubin: 0.3 mg/dL (ref 0.2–1.2)
Total Protein: 8 g/dL (ref 6.0–8.3)

## 2022-08-27 LAB — H. PYLORI ANTIBODY, IGG: H Pylori IgG: NEGATIVE

## 2022-08-27 LAB — LIPASE: Lipase: 8 U/L — ABNORMAL LOW (ref 11.0–59.0)

## 2022-08-27 MED ORDER — DICYCLOMINE HCL 10 MG PO CAPS: 10.0000 mg | ORAL_CAPSULE | Freq: Three times a day (TID) | ORAL | 0 refills | Status: DC

## 2022-08-27 NOTE — Patient Instructions (Signed)
It was great to see you!  Referral back to gastroenterology  Trial bentyl/dicyclomine as needed for stomach pain  Take pecpid daily x 2 weeks  Take care,  Inda Coke PA-C

## 2022-08-27 NOTE — Progress Notes (Signed)
Kelsey Curtis is a 31 y.o. female here for a new problem.  History of Present Illness:   Chief Complaint  Patient presents with   Abdominal Pain    Pt c/o lower abd pain x several months, worse after eating spicy foods, acidic, fried and dairy. Denies diarrhea.    HPI  The patient is in the office today for an office visit.  The patient complains of feeling bloated, gassy, and experiencing cramp like pain in lower stomach whenever she consumes any food. She denies experiencing rectal bleeding, nausea, or vaginal pain. Reports of bowel movements about every other day. She has a hx of chronic constipation, requiring Linzess in the past and she had improvement of symptoms. She was going to take a lower dose with GI as the higher dose caused diarrhea, but she failed to follow-up. She does not feel like this is currently the culprit of her symptoms.  She is currently taking 20 mg Pepcid and reports no new issues while taking it.   She currently has issues with tolerating spicy and acidic foods, fried foods and dairy.   Past Medical History:  Diagnosis Date   Pyelonephritis      Social History   Tobacco Use   Smoking status: Never    Passive exposure: Past   Smokeless tobacco: Never  Vaping Use   Vaping Use: Never used  Substance Use Topics   Alcohol use: Yes    Comment: occ   Drug use: No    Past Surgical History:  Procedure Laterality Date   NO PAST SURGERIES      Family History  Problem Relation Age of Onset   Hyperlipidemia Mother    Hyperlipidemia Father    Alzheimer's disease Maternal Grandmother    Colon cancer Neg Hx    Breast cancer Neg Hx     No Known Allergies  Current Medications:   Current Outpatient Medications:    APRI 0.15-30 MG-MCG tablet, Take 1 tablet by mouth daily., Disp: 84 tablet, Rfl: 3   busPIRone (BUSPAR) 5 MG tablet, TAKE 1 TABLET BY MOUTH THREE TIMES A DAY AS NEEDED, Disp: 270 tablet, Rfl: 1   Cyanocobalamin (VITAMIN B 12) 500 MCG  TABS, Take 1 tablet by mouth daily in the afternoon., Disp: , Rfl:    dicyclomine (BENTYL) 10 MG capsule, Take 1 capsule (10 mg total) by mouth 4 (four) times daily -  before meals and at bedtime., Disp: 30 capsule, Rfl: 0   Famotidine (PEPCID AC PO), Take 20 mg by mouth in the morning and at bedtime. PEPCID OTC, Disp: , Rfl:    naproxen sodium (ALEVE) 220 MG tablet, Take 440 mg by mouth as needed., Disp: , Rfl:    traZODone (DESYREL) 50 MG tablet, Take 1 tablet (50 mg total) by mouth at bedtime as needed for sleep., Disp: 90 tablet, Rfl: 3   valACYclovir (VALTREX) 1000 MG tablet, Take two tablets ( total 2000 mg) by mouth q12h x 1 day; Start: ASAP after symptom onset, Disp: 30 tablet, Rfl: 1   Vitamin D, Cholecalciferol, 25 MCG (1000 UT) CAPS, Take 1 capsule by mouth daily in the afternoon., Disp: , Rfl:    Review of Systems:   Review of Systems  Gastrointestinal:  Positive for abdominal pain (cramping pain). Negative for nausea.       (+)bloating (+)frequent flatulence  Genitourinary:        (-)rectal bleeding (-)vaginal pain  Negative unless otherwise specified per HPI.  Vitals:   Vitals:  08/27/22 1009  BP: 122/80  Pulse: 76  Temp: 98.6 F (37 C)  TempSrc: Temporal  SpO2: 98%  Weight: 230 lb 8 oz (104.6 kg)  Height: 5\' 4"  (1.626 m)     Body mass index is 39.57 kg/m.  Physical Exam:   Physical Exam Constitutional:      General: She is not in acute distress.    Appearance: Normal appearance. She is not ill-appearing.  HENT:     Head: Normocephalic and atraumatic.     Right Ear: External ear normal.     Left Ear: External ear normal.  Eyes:     Extraocular Movements: Extraocular movements intact.     Pupils: Pupils are equal, round, and reactive to light.  Cardiovascular:     Rate and Rhythm: Normal rate and regular rhythm.     Heart sounds: Normal heart sounds. No murmur heard.    No gallop.  Pulmonary:     Effort: Pulmonary effort is normal. No respiratory  distress.     Breath sounds: Normal breath sounds. No wheezing or rales.  Abdominal:     General: Abdomen is flat. Bowel sounds are normal.     Palpations: Abdomen is soft.     Tenderness: There is no abdominal tenderness. There is no right CVA tenderness, left CVA tenderness, guarding or rebound.  Skin:    General: Skin is warm and dry.  Neurological:     Mental Status: She is alert and oriented to person, place, and time.  Psychiatric:        Judgment: Judgment normal.     Assessment and Plan:   Lower abdominal pain Suspect IBS constipation Recommend working on bowel regimen but she does not feel like this is the problem Push fluids, clean up diet and avoid triggers Provided rx of bentyl for her to take prn for abdominal spasms Recommend re-connect with GI Update blood work to rule out organic cause Consider pepcid for 1-2 weeks Worsening precautions advised  as a Engineer, structural for Neurosurgeon, PA.,have documented all relevant documentation on the behalf of [employer], PA,as directed by  Jarold Motto, PA while in the presence of Jarold Motto, Jarold Motto.  I, [place], Jarold Motto, have reviewed all documentation for this visit. The documentation on 08/27/22 for the exam, diagnosis, procedures, and orders are all accurate and complete.   10/27/22, PA-C

## 2022-08-28 ENCOUNTER — Ambulatory Visit: Payer: 59 | Admitting: Physician Assistant

## 2022-09-19 ENCOUNTER — Ambulatory Visit (INDEPENDENT_AMBULATORY_CARE_PROVIDER_SITE_OTHER): Payer: 59 | Admitting: Gastroenterology

## 2022-09-19 ENCOUNTER — Encounter: Payer: Self-pay | Admitting: Gastroenterology

## 2022-09-19 VITALS — BP 124/70 | HR 73 | Ht 65.0 in | Wt 234.2 lb

## 2022-09-19 DIAGNOSIS — R1030 Lower abdominal pain, unspecified: Secondary | ICD-10-CM | POA: Diagnosis not present

## 2022-09-19 DIAGNOSIS — K5900 Constipation, unspecified: Secondary | ICD-10-CM | POA: Diagnosis not present

## 2022-09-19 MED ORDER — PANTOPRAZOLE SODIUM 40 MG PO TBEC: 40.0000 mg | DELAYED_RELEASE_TABLET | Freq: Every day | ORAL | 11 refills | Status: DC

## 2022-09-19 NOTE — Patient Instructions (Signed)
We have sent the following medications to your pharmacy for you to pick up at your convenience: pantoprazole.   You have been scheduled for an endoscopy. Please follow written instructions given to you at your visit today. If you use inhalers (even only as needed), please bring them with you on the day of your procedure.  The [facility] providers would like to encourage you to use MYCHART to communicate with providers for non-urgent requests or questions.  Due to long hold times on the telephone, sending your provider a message by MYCHART may be a faster and more efficient way to get a response.  Please allow 48 business hours for a response.  Please remember that this is for non-urgent requests.  Due to recent changes in healthcare laws, you may see the results of your imaging and laboratory studies on MyChart before your provider has had a chance to review them.  We understand that in some cases there may be results that are confusing or concerning to you. Not all laboratory results come back in the same time frame and the provider may be waiting for multiple results in order to interpret others.  Please give us 48 hours in order for your provider to thoroughly review all the results before contacting the office for clarification of your results.     Thank you for choosing me and [facility].  Malcolm T. Stark, Jr., MD., FACG   

## 2022-09-19 NOTE — Progress Notes (Signed)
    Assessment     Lower abdominal pain R/O gastritis, duodenitis, ulcer, IBS Constipation, diet controlled Lactose intolerance    Recommendations    Begin pantoprazole 40 mg daily Schedule EGD. The risks (including bleeding, perforation, infection, missed lesions, medication reactions and possible hospitalization or surgery if complications occur), benefits, and alternatives to endoscopy with possible biopsy and possible dilation were discussed with the patient and they consent to proceed.   Lactose avoidance or use Lactaid pills prn   HPI    This is a 31 year old female female with frequent lower abdominal pain related to certain foods.  She notes that acidic foods, spicy foods and dairy products cause lower abdominal pain, often localized to the left lower quadrant.  She has had difficulties with lactose intolerance for years however spicy foods and acidic foods have been problematic over the past few months.  She notes temporary relief of her symptoms with Alka-Seltzer and Mylanta.  She has had problems with constipation in the past however her constipation is currently diet controlled. Denies weight loss, diarrhea, change in stool caliber, melena, hematochezia, nausea, vomiting, dysphagia, reflux symptoms, chest pain.    Labs / Imaging       Latest Ref Rng & Units 08/27/2022   10:34 AM 03/14/2022   12:19 PM 09/09/2020    8:43 AM  Hepatic Function  Total Protein 6.0 - 8.3 g/dL 8.0  7.6  6.9   Albumin 3.5 - 5.2 g/dL 3.9  3.7    AST 0 - 37 U/L _0 ALT 0 - 35 U/L _1 Alk Phosphatase 39 - 117 U/L 84  68    Total Bilirubin 0.2 - 1.2 mg/dL 0.3  0.9  0.5        Latest Ref Rng & Units 03/14/2022   12:19 PM 08/29/2021   10:24 PM 09/09/2020    8:43 AM  CBC  WBC 4.0 - 10.5 K/uL 21.1  11.1  7.2   Hemoglobin 12.0 - 15.0 g/dL 13.2  13.8  13.5   Hematocrit 36.0 - 46.0 % 40.0  41.7  40.6   Platelets 150 - 400 K/uL 251  308  301     Current Medications, Allergies,  Past Medical History, Past Surgical History, Family History and Social History were reviewed in [employer] record.   Physical Exam: General: Well developed, well nourished, no acute distress Head: Normocephalic and atraumatic Eyes: Sclerae anicteric, EOMI Ears: Normal auditory acuity Mouth: Not examined Lungs: Clear throughout to auscultation Heart: Regular rate and rhythm; no murmurs, rubs or bruits Abdomen: Soft, minimal LLQ tenderness and non distended. No masses, hepatosplenomegaly or hernias noted. Normal Bowel sounds Rectal: Not done Musculoskeletal: Symmetrical with no gross deformities  Pulses:  Normal pulses noted Extremities: No clubbing, cyanosis, edema or deformities noted Neurological: Alert oriented x 4, grossly nonfocal Psychological:  Alert and cooperative. Normal mood and affect   Satish Hammers T. Fuller Plan, MD 09/19/2022, 9:56 AM

## 2022-10-16 ENCOUNTER — Encounter: Payer: Self-pay | Admitting: Gastroenterology

## 2022-10-16 ENCOUNTER — Ambulatory Visit (AMBULATORY_SURGERY_CENTER): Payer: 59 | Admitting: Gastroenterology

## 2022-10-16 VITALS — BP 126/74 | HR 73 | Temp 98.0°F | Resp 12 | Ht 65.0 in | Wt 234.0 lb

## 2022-10-16 DIAGNOSIS — R1030 Lower abdominal pain, unspecified: Secondary | ICD-10-CM

## 2022-10-16 DIAGNOSIS — K225 Diverticulum of esophagus, acquired: Secondary | ICD-10-CM | POA: Diagnosis present

## 2022-10-16 LAB — POCT URINE PREGNANCY: Preg Test, Ur: NEGATIVE

## 2022-10-16 MED ORDER — SODIUM CHLORIDE 0.9 % IV SOLN: 500.0000 mL | Freq: Once | INTRAVENOUS | Status: DC

## 2022-10-16 NOTE — Op Note (Signed)
[facility] Patient Name: Kelsey Curtis Procedure Date: 10/16/2022 9:54 AM MRN: [id number] Endoscopist: Meryl Dare , MD, [phone] Age: 31 Referring MD:  Date of Birth: [birth year] Gender: Female Account #: [id number] Procedure:                Upper GI endoscopy Indications:              Lower abdominal pain Medicines:                Monitored Anesthesia Care Procedure:                Pre-Anesthesia Assessment:                           - Prior to the procedure, a History and Physical                            was performed, and patient medications and                            allergies were reviewed. The patient's tolerance of                            previous anesthesia was also reviewed. The risks                            and benefits of the procedure and the sedation                            options and risks were discussed with the patient.                            All questions were answered, and informed consent                            was obtained. Prior Anticoagulants: The patient has                            taken no anticoagulant or antiplatelet agents. ASA                            Grade Assessment: II - A patient with mild systemic                            disease. After reviewing the risks and benefits,                            the patient was deemed in satisfactory condition to                            undergo the procedure.                           After obtaining informed consent, the endoscope was  passed under direct vision. Throughout the                            procedure, the patient's blood pressure, pulse, and                            oxygen saturations were monitored continuously. The                            Endoscope was introduced through the mouth, and                            advanced to the second part of duodenum. The upper                            GI endoscopy was accomplished  without difficulty.                            The patient tolerated the procedure well. Scope In: Scope Out: Findings:                 The esophagus was normal.                           A small non-bleeding diverticulum was found in the                            gastric fundus.                           The examined duodenum was normal.                           The exam of the stomach was otherwise normal. Complications:            No immediate complications. Estimated Blood Loss:     Estimated blood loss: none. Impression:               - Normal esophagus.                           - Gastric fundus diverticulum.                           - Otherwise normal appearing stomach.                           - Normal examined duodenum.                           - No specimens collected. Recommendation:           - Patient has a contact number available for                            emergencies. The signs and symptoms of potential  delayed complications were discussed with the                            patient. Return to normal activities tomorrow.                            Written discharge instructions were provided to the                            patient.                           - Resume previous diet.                           - Continue present medications.                           - If symptoms persist please schedule a GI office                            appt in 2 months. Meryl Dare, MD 10/16/2022 10:14:38 AM This report has been signed electronically.

## 2022-10-16 NOTE — Progress Notes (Signed)
VS completed by DT.  Pt's states no medical or surgical changes since previsit or office visit.  

## 2022-10-16 NOTE — Patient Instructions (Signed)
Resume previous diet.  Continue present medications.  If symptoms persist please schedule a GI office appointment in 2 months.   YOU HAD AN ENDOSCOPIC PROCEDURE TODAY AT THE [facility]:   Refer to the procedure report that was given to you for any specific questions about what was found during the examination.  If the procedure report does not answer your questions, please call your gastroenterologist to clarify.  If you requested that your care partner not be given the details of your procedure findings, then the procedure report has been included in a sealed envelope for you to review at your convenience later.  YOU SHOULD EXPECT: Some feelings of bloating in the abdomen. Passage of more gas than usual.  Walking can help get rid of the air that was put into your GI tract during the procedure and reduce the bloating. If you had a lower endoscopy (such as a colonoscopy or flexible sigmoidoscopy) you may notice spotting of blood in your stool or on the toilet paper. If you underwent a bowel prep for your procedure, you may not have a normal bowel movement for a few days.  Please Note:  You might notice some irritation and congestion in your nose or some drainage.  This is from the oxygen used during your procedure.  There is no need for concern and it should clear up in a day or so.  SYMPTOMS TO REPORT IMMEDIATELY:  Following upper endoscopy (EGD)  Vomiting of blood or coffee ground material  New chest pain or pain under the shoulder blades  Painful or persistently difficult swallowing  New shortness of breath  Fever of 100F or higher  Black, tarry-looking stools  For urgent or emergent issues, a gastroenterologist can be reached at any hour by calling [phone]. Do not use MyChart messaging for urgent concerns.    DIET:  We do recommend a small meal at first, but then you may proceed to your regular diet.  Drink plenty of fluids but you should avoid alcoholic beverages for  24 hours.  ACTIVITY:  You should plan to take it easy for the rest of today and you should NOT DRIVE or use heavy machinery until tomorrow (because of the sedation medicines used during the test).    FOLLOW UP: Our staff will call the number listed on your records the next business day following your procedure.  We will call around 7:15- 8:00 am to check on you and address any questions or concerns that you may have regarding the information given to you following your procedure. If we do not reach you, we will leave a message.     If any biopsies were taken you will be contacted by phone or by letter within the next 1-3 weeks.  Please call us at [phone] if you have not heard about the biopsies in 3 weeks.    SIGNATURES/CONFIDENTIALITY: You and/or your care partner have signed paperwork which will be entered into your electronic medical record.  These signatures attest to the fact that that the information above on your After Visit Summary has been reviewed and is understood.  Full responsibility of the confidentiality of this discharge information lies with you and/or your care-partner.

## 2022-10-16 NOTE — Progress Notes (Signed)
See 11/29/2021 H&P, no changes.  °

## 2022-10-16 NOTE — Progress Notes (Signed)
Vss nad trans tp pacu

## 2022-10-17 ENCOUNTER — Telehealth: Payer: Self-pay

## 2022-10-17 NOTE — Telephone Encounter (Signed)
  Follow up Call-     10/16/2022    9:28 AM  Call back number  Post procedure Call Back phone  # [phone]  Permission to leave phone message Yes     Patient questions:  Do you have a fever, pain , or abdominal swelling? No. Pain Score  0 *  Have you tolerated food without any problems? Yes.    Have you been able to return to your normal activities? Yes.    Do you have any questions about your discharge instructions: Diet   No. Medications  No. Follow up visit  No.  Do you have questions or concerns about your Care? No.  Actions: * If pain score is 4 or above: No action needed, pain <4.

## 2023-01-03 ENCOUNTER — Ambulatory Visit (INDEPENDENT_AMBULATORY_CARE_PROVIDER_SITE_OTHER): Payer: 59 | Admitting: Physician Assistant

## 2023-01-03 ENCOUNTER — Encounter: Payer: Self-pay | Admitting: Physician Assistant

## 2023-01-03 VITALS — BP 123/80 | HR 80 | Temp 98.1°F | Resp 16 | Ht 65.0 in | Wt 241.8 lb

## 2023-01-03 DIAGNOSIS — R300 Dysuria: Secondary | ICD-10-CM

## 2023-01-03 DIAGNOSIS — E785 Hyperlipidemia, unspecified: Secondary | ICD-10-CM

## 2023-01-03 LAB — POCT URINALYSIS DIPSTICK
Bilirubin, UA: NEGATIVE
Blood, UA: NEGATIVE
Glucose, UA: NEGATIVE
Ketones, UA: NEGATIVE
Nitrite, UA: NEGATIVE
Protein, UA: NEGATIVE
Spec Grav, UA: 1.015 (ref 1.010–1.025)
Urobilinogen, UA: 1 E.U./dL
pH, UA: 6 (ref 5.0–8.0)

## 2023-01-03 LAB — LIPID PANEL
Cholesterol: 233 mg/dL — ABNORMAL HIGH (ref 0–200)
HDL: 63.7 mg/dL (ref >39.00)
LDL Cholesterol: 155 mg/dL — ABNORMAL HIGH (ref 0–99)
NonHDL: 168.93
Total CHOL/HDL Ratio: 4
Triglycerides: 71 mg/dL (ref 0.0–149.0)
VLDL: 14.2 mg/dL (ref 0.0–40.0)

## 2023-01-03 MED ORDER — CEPHALEXIN 500 MG PO CAPS: 500.0000 mg | ORAL_CAPSULE | Freq: Two times a day (BID) | ORAL | 0 refills | Status: DC

## 2023-01-03 NOTE — Patient Instructions (Addendum)
It was great to see you!  Start keflex 500 mg twice daily for suspected UTI I will be in touch with urine follow-up testing results  Push fluids  General instructions Make sure you: Pee until your bladder is empty. Do not hold pee for a long time. Empty your bladder after sex. Wipe from front to back after pooping if you are a female. Use each tissue one time when you wipe. Drink enough fluid to keep your pee pale yellow. Keep all follow-up visits as told by your doctor. This is important. Contact a doctor if: You do not get better after 1-2 days. Your symptoms go away and then come back. Get help right away if: You have very bad back pain. You have very bad pain in your lower belly. You have a fever. You are sick to your stomach (nauseous). You are throwing up.

## 2023-01-03 NOTE — Progress Notes (Signed)
Kelsey Curtis is a 32 y.o. female here for a follow up of a pre-existing problem.  History of Present Illness:   Chief Complaint  Patient presents with   Dysuria    Frequent UTI's Symptoms - frequency and odor x several days     Urinary frequency Having some body aches, urinary frequency, odor in urine x a few days No fevers, chills, n/v/d, malaise, vaginal discharge, concerns for pregnancy Has been holding her bladder a bit more at work and not getting enough water at work  HLD She would like lipid panel rechecked today  Past Medical History:  Diagnosis Date   Pyelonephritis      Social History   Tobacco Use   Smoking status: Never    Passive exposure: Past   Smokeless tobacco: Never  Vaping Use   Vaping Use: Never used  Substance Use Topics   Alcohol use: Yes    Comment: occ   Drug use: No    Past Surgical History:  Procedure Laterality Date   NO PAST SURGERIES      Family History  Problem Relation Age of Onset   Hyperlipidemia Mother    Hyperlipidemia Father    Alzheimer's disease Maternal Grandmother    Colon cancer Neg Hx    Breast cancer Neg Hx    Esophageal cancer Neg Hx    Rectal cancer Neg Hx    Stomach cancer Neg Hx     No Known Allergies  Current Medications:   Current Outpatient Medications:    APRI 0.15-30 MG-MCG tablet, Take 1 tablet by mouth daily., Disp: 84 tablet, Rfl: 3   busPIRone (BUSPAR) 5 MG tablet, TAKE 1 TABLET BY MOUTH THREE TIMES A DAY AS NEEDED, Disp: 270 tablet, Rfl: 1   Cyanocobalamin (VITAMIN B 12) 500 MCG TABS, Take 1 tablet by mouth daily in the afternoon., Disp: , Rfl:    dicyclomine (BENTYL) 10 MG capsule, Take 1 capsule (10 mg total) by mouth 4 (four) times daily -  before meals and at bedtime., Disp: 30 capsule, Rfl: 0   Famotidine (PEPCID AC PO), Take 20 mg by mouth in the morning and at bedtime. PEPCID OTC, Disp: , Rfl:    naproxen sodium (ALEVE) 220 MG tablet, Take 440 mg by mouth as needed., Disp: , Rfl:     pantoprazole (PROTONIX) 40 MG tablet, Take 1 tablet (40 mg total) by mouth daily., Disp: 30 tablet, Rfl: 11   traZODone (DESYREL) 50 MG tablet, Take 1 tablet (50 mg total) by mouth at bedtime as needed for sleep., Disp: 90 tablet, Rfl: 3   valACYclovir (VALTREX) 1000 MG tablet, Take two tablets ( total 2000 mg) by mouth q12h x 1 day; Start: ASAP after symptom onset, Disp: 30 tablet, Rfl: 1   Vitamin D, Cholecalciferol, 25 MCG (1000 UT) CAPS, Take 1 capsule by mouth daily in the afternoon., Disp: , Rfl:    Review of Systems:   Review of Systems  Genitourinary:  Positive for dysuria.   Negative unless otherwise specified per HPI.  Vitals:   Vitals:   01/03/23 0940  BP: 123/80  Pulse: 80  Resp: 16  Temp: 98.1 F (36.7 C)  TempSrc: Temporal  SpO2: 100%  Weight: 241 lb 12.8 oz (109.7 kg)  Height: 5' 5"$  (1.651 m)     Body mass index is 40.24 kg/m.  Physical Exam:   Physical Exam Vitals and nursing note reviewed.  Constitutional:      General: She is not in acute distress.  Appearance: She is well-developed. She is not ill-appearing or toxic-appearing.  Cardiovascular:     Rate and Rhythm: Normal rate and regular rhythm.     Pulses: Normal pulses.     Heart sounds: Normal heart sounds, S1 normal and S2 normal.  Pulmonary:     Effort: Pulmonary effort is normal.     Breath sounds: Normal breath sounds.  Skin:    General: Skin is warm and dry.  Neurological:     Mental Status: She is alert.     GCS: GCS eye subscore is 4. GCS verbal subscore is 5. GCS motor subscore is 6.  Psychiatric:        Speech: Speech normal.        Behavior: Behavior normal. Behavior is cooperative.    Results for orders placed or performed in visit on 01/03/23  POCT Urinalysis Dipstick  Result Value Ref Range   Color, UA yellow    Clarity, UA clear    Glucose, UA Negative Negative   Bilirubin, UA negative    Ketones, UA negative    Spec Grav, UA 1.015 1.010 - 1.025   Blood, UA negative     pH, UA 6.0 5.0 - 8.0   Protein, UA Negative Negative   Urobilinogen, UA 1.0 0.2 or 1.0 E.U./dL   Nitrite, UA negative    Leukocytes, UA Trace (A) Negative   Appearance     Odor      Assessment and Plan:   Dysuria Will go ahead and treat empirically for UTI given sx and hx Start keflex 500 mg BID x 7 days while await culture Push fluids ER precautions advised  Hyperlipidemia, unspecified hyperlipidemia type Update lipid panel and make recommendations accordingly  Inda Coke, PA-C

## 2023-01-04 LAB — URINE CULTURE
MICRO NUMBER:: 14570414
SPECIMEN QUALITY:: ADEQUATE

## 2023-02-25 ENCOUNTER — Ambulatory Visit (INDEPENDENT_AMBULATORY_CARE_PROVIDER_SITE_OTHER): Payer: 59 | Admitting: Physician Assistant

## 2023-02-25 ENCOUNTER — Encounter: Payer: Self-pay | Admitting: Physician Assistant

## 2023-02-25 VITALS — BP 124/70 | HR 88 | Temp 97.5°F | Ht 65.0 in | Wt 237.4 lb

## 2023-02-25 DIAGNOSIS — F5101 Primary insomnia: Secondary | ICD-10-CM | POA: Diagnosis not present

## 2023-02-25 DIAGNOSIS — R110 Nausea: Secondary | ICD-10-CM

## 2023-02-25 LAB — POCT URINE PREGNANCY: Preg Test, Ur: NEGATIVE

## 2023-02-25 MED ORDER — ONDANSETRON HCL 4 MG PO TABS: 4.0000 mg | ORAL_TABLET | Freq: Three times a day (TID) | ORAL | 0 refills | Status: DC | PRN

## 2023-02-25 MED ORDER — TRAZODONE HCL 50 MG PO TABS
75.0000 mg | ORAL_TABLET | Freq: Every evening | ORAL | 3 refills | Status: DC | PRN
Start: 2023-02-25 — End: 2023-05-01

## 2023-02-25 NOTE — Progress Notes (Signed)
Kelsey Curtis is a 32 y.o. female here for a follow up of a pre-existing problem.  History of Present Illness:   Chief Complaint  Patient presents with   Diarrhea    Pt c/o diarrhea, nausea, loss of appetitie x 1 week, diarrhea stopped yesterday, but still nauseated. 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    Diarrhea     Diarrhea Patient reports diarrhea, nausea, fatigue and poor appetite x 1 week Taking pepto-bismol and alka seltzer Denies: recent travel, sick contacts, lightheadedness, dizziness, fever, chills, abd pain   Insomnia Taking trazodone 50 mg most nights She is still having occasional issues sleeping Denies worsening anxiety or changes in sleep hygiene   Patient's last menstrual period was 02/11/2023 (approximate).  Past Medical History:  Diagnosis Date   Pyelonephritis      Social History   Tobacco Use   Smoking status: Never    Passive exposure: Past   Smokeless tobacco: Never  Vaping Use   Vaping Use: Never used  Substance Use Topics   Alcohol use: Yes    Comment: occ   Drug use: No    Past Surgical History:  Procedure Laterality Date   NO PAST SURGERIES      Family History  Problem Relation Age of Onset   Hyperlipidemia Mother    Hyperlipidemia Father    Alzheimer's disease Maternal Grandmother    Colon cancer Neg Hx    Breast cancer Neg Hx    Esophageal cancer Neg Hx    Rectal cancer Neg Hx    Stomach cancer Neg Hx     No Known Allergies  Current Medications:   Current Outpatient Medications:    APRI 0.15-30 MG-MCG tablet, Take 1 tablet by mouth daily., Disp: 84 tablet, Rfl: 3   busPIRone (BUSPAR) 5 MG tablet, TAKE 1 TABLET BY MOUTH THREE TIMES A DAY AS NEEDED, Disp: 270 tablet, Rfl: 1   Cyanocobalamin (VITAMIN B 12) 500 MCG TABS, Take 1 tablet by mouth daily in the afternoon., Disp: , Rfl:    naproxen sodium (ALEVE) 220 MG tablet, Take 440 mg by mouth as needed., Disp: , Rfl:    ondansetron (ZOFRAN) 4 MG tablet, Take 1 tablet  (4 mg total) by mouth every 8 (eight) hours as needed for nausea or vomiting., Disp: 30 tablet, Rfl: 0   pantoprazole (PROTONIX) 40 MG tablet, Take 1 tablet (40 mg total) by mouth daily., Disp: 30 tablet, Rfl: 11   traZODone (DESYREL) 50 MG tablet, Take 1 tablet (50 mg total) by mouth at bedtime as needed for sleep., Disp: 90 tablet, Rfl: 3   valACYclovir (VALTREX) 1000 MG tablet, Take two tablets ( total 2000 mg) by mouth q12h x 1 day; Start: ASAP after symptom onset, Disp: 30 tablet, Rfl: 1   Vitamin D, Cholecalciferol, 25 MCG (1000 UT) CAPS, Take 1 capsule by mouth daily in the afternoon., Disp: , Rfl:    Review of Systems:   Review of Systems  Gastrointestinal:  Positive for diarrhea.   Negative unless otherwise specified per HPI.  Vitals:   Vitals:   02/25/23 1306  BP: 124/70  Pulse: 88  Temp: (!) 97.5 F (36.4 C)  TempSrc: Temporal  Weight: 237 lb 6.1 oz (107.7 kg)  Height: 5\' 5"  (1.651 m)     Body mass index is 39.5 kg/m.  Physical Exam:   Physical Exam Vitals and nursing note reviewed.  Constitutional:      General: She is not in acute distress.  Appearance: She is well-developed. She is not ill-appearing or toxic-appearing.  Cardiovascular:     Rate and Rhythm: Normal rate and regular rhythm.     Pulses: Normal pulses.     Heart sounds: Normal heart sounds, S1 normal and S2 normal.  Pulmonary:     Effort: Pulmonary effort is normal.     Breath sounds: Normal breath sounds.  Abdominal:     General: Abdomen is flat. Bowel sounds are normal.     Palpations: Abdomen is soft.     Tenderness: There is no abdominal tenderness.  Skin:    General: Skin is warm and dry.  Neurological:     Mental Status: She is alert.     GCS: GCS eye subscore is 4. GCS verbal subscore is 5. GCS motor subscore is 6.  Psychiatric:        Speech: Speech normal.        Behavior: Behavior normal. Behavior is cooperative.    No results found for any visits on  02/25/23.   Assessment and Plan:   Primary insomnia Uncontrolled Trial increase to trazodone 75 mg daily May also consider adding buspar Follow-up if lack of improvement or any other concerns  Nausea Ongoing but improving Urine preg neg Thankfully diarrhea improving Suspect viral gastroenteritis No red flags Trial zofran prn   Jarold Motto, PA-C

## 2023-02-25 NOTE — Patient Instructions (Addendum)
It was great to see you!  May take zofran as needed for nausea  Continue to push fluids and eat bland foods  Try increase trazodone to 75 mg (1.5 tablets) Consider buspar in addition to or instead of this for sleeping  Keep me posted!  Take care,  Jarold Motto PA-C

## 2023-04-12 ENCOUNTER — Ambulatory Visit: Payer: 59 | Admitting: Physician Assistant

## 2023-04-16 ENCOUNTER — Ambulatory Visit: Payer: 59 | Admitting: Physician Assistant

## 2023-04-16 ENCOUNTER — Telehealth (INDEPENDENT_AMBULATORY_CARE_PROVIDER_SITE_OTHER): Payer: 59 | Admitting: Physician Assistant

## 2023-04-16 ENCOUNTER — Telehealth: Payer: 59 | Admitting: Physician Assistant

## 2023-04-16 ENCOUNTER — Encounter: Payer: Self-pay | Admitting: Physician Assistant

## 2023-04-16 DIAGNOSIS — R051 Acute cough: Secondary | ICD-10-CM | POA: Diagnosis not present

## 2023-04-16 DIAGNOSIS — R3915 Urgency of urination: Secondary | ICD-10-CM

## 2023-04-16 DIAGNOSIS — E669 Obesity, unspecified: Secondary | ICD-10-CM

## 2023-04-16 MED ORDER — WEGOVY 0.25 MG/0.5ML ~~LOC~~ SOAJ: 0.2500 mg | SUBCUTANEOUS | 1 refills | Status: DC

## 2023-04-16 NOTE — Progress Notes (Signed)
Virtual Visit via Video Note   I, Jarold Motto, connected with  Kelsey Curtis  ([id number], Apr 23, 1991) on 04/16/23 at  3:20 PM EDT by a video-enabled telemedicine application and verified that I am speaking with the correct person using two identifiers.  Location: Patient: Home Provider: [facility] office   I discussed the limitations of evaluation and management by telemedicine and the availability of in person appointments. The patient expressed understanding and agreed to proceed.    History of Present Illness: Kelsey Curtis is a 32 y.o. who identifies as a female who was assigned female at birth, and is being seen today for cough and obesity  Cough Patient reports that she had a cough for approximately 1 week. She tried to schedule with our office but unfortunately the provider called out sick. Patient has taken over-the-counter medications such as cold and flu medicines, and this has helped her symptoms  Denies fever, chills, malaise.   Symptoms are improving with time   Obesity Patient was on Wegovy 0.25 mg weekly last year however she developed a kidney infection and stopped this medication She would like to restart this medication if possible Denies family history of thyroid cancer or desires for pregnancy She is currently going to the gym regularly and trying to work on healthy diet her most recent BMI measured in our office on 01/03/2023 was 40.24   Urinary urgency She is experiencing some urgency with urination Denies frequency, hematuria, pain, back pain, nausea, vomiting, diarrhea  Problems:  Patient Active Problem List   Diagnosis Date Noted   HLD (hyperlipidemia) 08/01/2022   Anxiety 07/11/2021   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    Allergies: No Known Allergies Medications:  Current Outpatient Medications:    Semaglutide-Weight Management (WEGOVY) 0.25 MG/0.5ML SOAJ, Inject 0.25 mg into the skin once a week., Disp: 2 mL, Rfl: 1   APRI 0.15-30 MG-MCG  tablet, Take 1 tablet by mouth daily., Disp: 84 tablet, Rfl: 3   busPIRone (BUSPAR) 5 MG tablet, TAKE 1 TABLET BY MOUTH THREE TIMES A DAY AS NEEDED, Disp: 270 tablet, Rfl: 1   Cyanocobalamin (VITAMIN B 12) 500 MCG TABS, Take 1 tablet by mouth daily in the afternoon., Disp: , Rfl:    naproxen sodium (ALEVE) 220 MG tablet, Take 440 mg by mouth as needed., Disp: , Rfl:    ondansetron (ZOFRAN) 4 MG tablet, Take 1 tablet (4 mg total) by mouth every 8 (eight) hours as needed for nausea or vomiting., Disp: 30 tablet, Rfl: 0   pantoprazole (PROTONIX) 40 MG tablet, Take 1 tablet (40 mg total) by mouth daily., Disp: 30 tablet, Rfl: 11   traZODone (DESYREL) 50 MG tablet, Take 1.5 tablets (75 mg total) by mouth at bedtime as needed for sleep., Disp: 90 tablet, Rfl: 3   valACYclovir (VALTREX) 1000 MG tablet, Take two tablets ( total 2000 mg) by mouth q12h x 1 day; Start: ASAP after symptom onset, Disp: 30 tablet, Rfl: 1   Vitamin D, Cholecalciferol, 25 MCG (1000 UT) CAPS, Take 1 capsule by mouth daily in the afternoon., Disp: , Rfl:   Observations/Objective: Patient is well-developed, well-nourished in no acute distress.  Resting comfortably  at home.  Head is normocephalic, atraumatic.  No labored breathing.  Speech is clear and coherent with logical content.  Patient is alert and oriented at baseline.    Assessment and Plan: 1. Urinary urgency No red flags Patient is prone to urinary tract infection secondary to [place] pretty quickly Will order urinalysis  and she will come by sometime this week to have this done Do not feel as though she needs empiric antibiotics at this time, but she will let us know if this changes - Urinalysis, Routine w reflex microscopic; Future - Urine Culture; Future  2. Acute cough Suspect viral upper respiratory infection (URI) Continue to monitor symptoms and reach out if any new or worsening symptoms  3. Obesity, unspecified classification, unspecified obesity type,  unspecified whether serious comorbidity present Suspect patient will be a great candidate to restart Wegovy Discussed that she will need to stay on top of her constipation management with this I have sent this medication and we will continue 0.25 mg for 8 weeks and I have asked her to follow-up with me in 4 to 6 weeks to discuss progress with medication, sooner if concerns  Follow Up Instructions: I discussed the assessment and treatment plan with the patient. The patient was provided an opportunity to ask questions and all were answered. The patient agreed with the plan and demonstrated an understanding of the instructions.  A copy of instructions were sent to the patient via MyChart unless otherwise noted below.   The patient was advised to call back or seek an in-person evaluation if the symptoms worsen or if the condition fails to improve as anticipated.  Jarold Motto, [place]

## 2023-04-18 ENCOUNTER — Other Ambulatory Visit: Payer: 59

## 2023-04-18 DIAGNOSIS — R3915 Urgency of urination: Secondary | ICD-10-CM

## 2023-04-18 LAB — URINALYSIS, ROUTINE W REFLEX MICROSCOPIC
Bilirubin Urine: NEGATIVE
Hgb urine dipstick: NEGATIVE
Ketones, ur: NEGATIVE
Leukocytes,Ua: NEGATIVE
Nitrite: NEGATIVE
Specific Gravity, Urine: 1.025 (ref 1.000–1.030)
Total Protein, Urine: NEGATIVE
Urine Glucose: NEGATIVE
Urobilinogen, UA: 1 (ref 0.0–1.0)
pH: 7 (ref 5.0–8.0)

## 2023-04-19 ENCOUNTER — Telehealth: Payer: Self-pay | Admitting: *Deleted

## 2023-04-19 ENCOUNTER — Encounter: Payer: Self-pay | Admitting: Physician Assistant

## 2023-04-19 LAB — URINE CULTURE
MICRO NUMBER:: 15020372
SPECIMEN QUALITY:: ADEQUATE

## 2023-04-19 NOTE — Telephone Encounter (Signed)
PA needed for Wegovy 0.25 mg. PA done thru [employer]. Awaiting determination. KEY: [id number]

## 2023-04-22 NOTE — Telephone Encounter (Signed)
Pt was called for an update. States she recieved a notification of denial from [facility]. States if she can't get this medication, she is interested in restarting the phentermine if the [facility] is absolutely not achievable.

## 2023-04-23 ENCOUNTER — Other Ambulatory Visit: Payer: Self-pay | Admitting: Physician Assistant

## 2023-04-23 MED ORDER — PHENTERMINE HCL 37.5 MG PO TABS: 37.5000 mg | ORAL_TABLET | Freq: Every day | ORAL | 0 refills | Status: DC

## 2023-04-23 NOTE — Telephone Encounter (Signed)
Appeal done for Wegovy 0.25 mg thru Cover[employer]eds, Awaiting determination. KEY: [id number]

## 2023-04-23 NOTE — Telephone Encounter (Signed)
Received response from [employer] , PA for Wegovy 0.25 mg has been denied.

## 2023-04-30 NOTE — Telephone Encounter (Signed)
Received response from [employer] prior authorization for Wegovy 0.25 mg has been approved.

## 2023-04-30 NOTE — Telephone Encounter (Signed)
Spoke to pt told her PA for Reginal Lutes has been approved. I will call the pharmacy as soon as they open. Pt verbalized understanding and said okay to stop phentermine abruptly. Told her Lelon Mast said okay to stop and start [facility]. Pt verbalized understanding.

## 2023-04-30 NOTE — Telephone Encounter (Signed)
Called [facility] spoke Tyra to told her PA for Reginal Lutes has been approved. Tyra verbalized understanding.

## 2023-05-01 ENCOUNTER — Other Ambulatory Visit: Payer: Self-pay | Admitting: Physician Assistant

## 2023-05-22 ENCOUNTER — Telehealth: Payer: Self-pay | Admitting: *Deleted

## 2023-05-22 NOTE — Telephone Encounter (Signed)
Spoke to pt told her calling to make sure she knew the [facility] was approved till 04/2024. Pt said yes, she started it 2 weeks ago. Told her okay, just wanted to make sure I just saw from insurance. Pt verbalized understanding.

## 2023-06-10 ENCOUNTER — Telehealth: Payer: Self-pay | Admitting: Physician Assistant

## 2023-06-10 MED ORDER — WEGOVY 0.5 MG/0.5ML ~~LOC~~ SOAJ: 0.5000 mg | SUBCUTANEOUS | 0 refills | Status: DC

## 2023-06-10 NOTE — Telephone Encounter (Signed)
Rx for Wegovy 0.5 mg sent to [facility] as requested. Pt notified.

## 2023-06-10 NOTE — Telephone Encounter (Signed)
Pt is asking to change Wegovy dosage from 0.47ml - 0.46ml. Please advise  Pharm: [facility] [address], [place], [place] [address] [phone]

## 2023-06-11 ENCOUNTER — Telehealth: Payer: Self-pay | Admitting: Physician Assistant

## 2023-06-11 MED ORDER — WEGOVY 0.5 MG/0.5ML ~~LOC~~ SOAJ: 0.5000 mg | SUBCUTANEOUS | 0 refills | Status: DC

## 2023-06-11 NOTE — Telephone Encounter (Signed)
Pt notified Rx for [facility] sent to pharmacy as requested.

## 2023-06-11 NOTE — Telephone Encounter (Signed)
Patient dropped off document FMLA, to be filled out by provider. Patient requested to send it back via Fax within 5-days. Document is located in providers tray at front office.Please advise at Mobile [phone] (mobile)

## 2023-06-11 NOTE — Addendum Note (Signed)
Addended by: Jimmye Norman on: 06/11/2023 10:58 AM   Modules accepted: Orders

## 2023-06-11 NOTE — Telephone Encounter (Signed)
Pt called back and states the [facility] is out of RX and if we can send a new RX to [facility] on [phone]. Please advise.

## 2023-06-11 NOTE — Telephone Encounter (Signed)
Please call pt and schedule an appt to discuss FMLA and f/u on Wegovy per [facility].

## 2023-06-24 ENCOUNTER — Encounter: Payer: Self-pay | Admitting: Physician Assistant

## 2023-06-24 ENCOUNTER — Ambulatory Visit: Payer: 59 | Admitting: Physician Assistant

## 2023-06-24 VITALS — BP 118/80 | HR 88 | Temp 97.7°F | Ht 65.0 in | Wt 231.0 lb

## 2023-06-24 DIAGNOSIS — F419 Anxiety disorder, unspecified: Secondary | ICD-10-CM | POA: Diagnosis not present

## 2023-06-24 DIAGNOSIS — R0789 Other chest pain: Secondary | ICD-10-CM

## 2023-06-24 DIAGNOSIS — R5383 Other fatigue: Secondary | ICD-10-CM | POA: Diagnosis not present

## 2023-06-24 LAB — COMPREHENSIVE METABOLIC PANEL
ALT: 15 U/L (ref 0–35)
AST: 13 U/L (ref 0–37)
Albumin: 3.7 g/dL (ref 3.5–5.2)
Alkaline Phosphatase: 74 U/L (ref 39–117)
BUN: 6 mg/dL (ref 6–23)
CO2: 26 mEq/L (ref 19–32)
Calcium: 8.5 mg/dL (ref 8.4–10.5)
Chloride: 105 mEq/L (ref 96–112)
Creatinine, Ser: 0.83 mg/dL (ref 0.40–1.20)
GFR: 92.82 mL/min (ref >60.00)
Glucose, Bld: 86 mg/dL (ref 70–99)
Potassium: 3.6 mEq/L (ref 3.5–5.1)
Sodium: 140 mEq/L (ref 135–145)
Total Bilirubin: 0.5 mg/dL (ref 0.2–1.2)
Total Protein: 6.9 g/dL (ref 6.0–8.3)

## 2023-06-24 LAB — LIPID PANEL
Cholesterol: 210 mg/dL — ABNORMAL HIGH (ref 0–200)
HDL: 62.8 mg/dL (ref >39.00)
LDL Cholesterol: 124 mg/dL — ABNORMAL HIGH (ref 0–99)
NonHDL: 147.47
Total CHOL/HDL Ratio: 3
Triglycerides: 115 mg/dL (ref 0.0–149.0)
VLDL: 23 mg/dL (ref 0.0–40.0)

## 2023-06-24 LAB — VITAMIN B12: Vitamin B-12: 429 pg/mL (ref 211–911)

## 2023-06-24 LAB — CBC WITH DIFFERENTIAL/PLATELET
Basophils Absolute: 0 K/uL (ref 0.0–0.1)
Basophils Relative: 0.2 % (ref 0.0–3.0)
Eosinophils Absolute: 0.1 K/uL (ref 0.0–0.7)
Eosinophils Relative: 0.7 % (ref 0.0–5.0)
HCT: 41.4 % (ref 36.0–46.0)
Hemoglobin: 13.2 g/dL (ref 12.0–15.0)
Lymphocytes Relative: 11.1 % — ABNORMAL LOW (ref 12.0–46.0)
Lymphs Abs: 1.1 K/uL (ref 0.7–4.0)
MCHC: 32 g/dL (ref 30.0–36.0)
MCV: 86.3 fl (ref 78.0–100.0)
Monocytes Absolute: 1 K/uL (ref 0.1–1.0)
Monocytes Relative: 10.5 % (ref 3.0–12.0)
Neutro Abs: 7.4 K/uL (ref 1.4–7.7)
Neutrophils Relative %: 77.5 % — ABNORMAL HIGH (ref 43.0–77.0)
Platelets: 300 K/uL (ref 150.0–400.0)
RBC: 4.8 Mil/uL (ref 3.87–5.11)
RDW: 13.5 % (ref 11.5–15.5)
WBC: 9.5 K/uL (ref 4.0–10.5)

## 2023-06-24 LAB — IBC + FERRITIN
Ferritin: 94.5 ng/mL (ref 10.0–291.0)
Iron: 38 ug/dL — ABNORMAL LOW (ref 42–145)
Saturation Ratios: 10.2 % — ABNORMAL LOW (ref 20.0–50.0)
TIBC: 372.4 ug/dL (ref 250.0–450.0)
Transferrin: 266 mg/dL (ref 212.0–360.0)

## 2023-06-24 LAB — VITAMIN D 25 HYDROXY (VIT D DEFICIENCY, FRACTURES): VITD: 41.99 ng/mL (ref 30.00–100.00)

## 2023-06-24 LAB — POCT URINE PREGNANCY: Preg Test, Ur: NEGATIVE

## 2023-06-24 LAB — TSH: TSH: 0.53 u[IU]/mL (ref 0.35–5.50)

## 2023-06-24 NOTE — Progress Notes (Signed)
Kelsey Curtis is a 32 y.o. female here for a new problem.  History of Present Illness:   Chief Complaint  Patient presents with   Chest Pain    Pt was having chest pain 2 weeks ago, was very anxious at the time. Denies chest now at present.   Fatigue    HPI  Chest Pain: Complains of intermittent left-sided chest pain beginning 2 weeks ago. Reports she was stressed and anxious before episodes of chest pain began.  Notes experiencing pressure in her left arm and left side of her chest, as well as shortness of breath.  States one day where she experienced chest pain all day.  Reports she has not consumed a lot of alcohol or caffeine, admits she drinks a soda occasionally.  She is not experiencing chest pains during this visit and did not experience this for the past week. Denies exacerbation of symptoms on exertion.   Fatigue: Complains of fatigue.  Compliant with her 75 mg Trazodone. Notes that for the past week she has been taking 50 mg.  States that she only uses Trazodone when needed, not daily.  Reports that she can usually push through her fatigue but she has been needing to nap more. Denies snoring.  Past Medical History:  Diagnosis Date   Pyelonephritis      Social History   Tobacco Use   Smoking status: Never    Passive exposure: Past   Smokeless tobacco: Never  Vaping Use   Vaping status: Never Used  Substance Use Topics   Alcohol use: Yes    Comment: occ   Drug use: No    Past Surgical History:  Procedure Laterality Date   NO PAST SURGERIES      Family History  Problem Relation Age of Onset   Hyperlipidemia Mother    Hyperlipidemia Father    Alzheimer's disease Maternal Grandmother    Colon cancer Neg Hx    Breast cancer Neg Hx    Esophageal cancer Neg Hx    Rectal cancer Neg Hx    Stomach cancer Neg Hx     No Known Allergies  Current Medications:   Current Outpatient Medications:    APRI 0.15-30 MG-MCG tablet, Take 1 tablet by mouth  daily., Disp: 84 tablet, Rfl: 3   Cyanocobalamin (VITAMIN B 12) 500 MCG TABS, Take 1 tablet by mouth daily in the afternoon., Disp: , Rfl:    naproxen sodium (ALEVE) 220 MG tablet, Take 440 mg by mouth as needed., Disp: , Rfl:    pantoprazole (PROTONIX) 40 MG tablet, Take 1 tablet (40 mg total) by mouth daily., Disp: 30 tablet, Rfl: 11   Semaglutide-Weight Management (WEGOVY) 0.5 MG/0.5ML SOAJ, Inject 0.5 mg into the skin once a week., Disp: 2 mL, Rfl: 0   traZODone (DESYREL) 50 MG tablet, TAKE 1 AND 1/2 TABLETS (75 MG TOTAL) BY MOUTH AT BEDTIME AS NEEDED FOR SLEEP, Disp: 135 tablet, Rfl: 0   valACYclovir (VALTREX) 1000 MG tablet, Take two tablets ( total 2000 mg) by mouth q12h x 1 day; Start: ASAP after symptom onset, Disp: 30 tablet, Rfl: 1   Vitamin D, Cholecalciferol, 25 MCG (1000 UT) CAPS, Take 1 capsule by mouth daily in the afternoon., Disp: , Rfl:    Review of Systems:   Review of Systems  Constitutional:  Positive for malaise/fatigue.  Respiratory:  Positive for shortness of breath.   Cardiovascular:  Positive for chest pain (intermittent, left sided).       (+) Pressure (Left  sided arm and chest)    Vitals:   Vitals:   06/24/23 0914  BP: 118/80  Pulse: 88  Temp: 97.7 F (36.5 C)  TempSrc: Temporal  Weight: 231 lb (104.8 kg)  Height: 5\' 5"  (1.651 m)     Body mass index is 38.44 kg/m.  Physical Exam:   Physical Exam Vitals and nursing note reviewed.  Constitutional:      General: She is not in acute distress.    Appearance: She is well-developed. She is not ill-appearing or toxic-appearing.  Cardiovascular:     Rate and Rhythm: Normal rate and regular rhythm.     Pulses: Normal pulses.     Heart sounds: Normal heart sounds, S1 normal and S2 normal.  Pulmonary:     Effort: Pulmonary effort is normal.     Breath sounds: Normal breath sounds.  Skin:    General: Skin is warm and dry.  Neurological:     Mental Status: She is alert.     GCS: GCS eye subscore is 4.  GCS verbal subscore is 5. GCS motor subscore is 6.  Psychiatric:        Speech: Speech normal.        Behavior: Behavior normal. Behavior is cooperative.    Results for orders placed or performed in visit on 06/24/23  POCT urine pregnancy  Result Value Ref Range   Preg Test, Ur Negative Negative    Assessment and Plan:   Anxiety No red flags She declines need for medication She has been working on coping skills and trying to stay positive Needs Family Medical Leave Act [facility]) filled out again today  Chest pressure EKG tracing is personally reviewed.  EKG notes NSR.  No acute changes.  Symptoms have resolved No symptom(s) suggesting of ACS She will continue to monitor them If new/worsening symptom(s), recommend close follow-up  Fatigue, unspecified type No red flags on my exam UPT neg Will update blood work to assess for possible organic cause We did discuss trialing 75 mg trazodone nightly to see if she can get benefit from this - she states that she will trial this   I,Emily Lagle,acting as a scribe for [employer], PA.,have documented all relevant documentation on the behalf of Jarold Motto, PA,as directed by  Jarold Motto, PA while in the presence of Jarold Motto, [place].  I, Jarold Motto, [place], have reviewed all documentation for this visit. The documentation on 06/24/23 for the exam, diagnosis, procedures, and orders are all accurate and complete.  Jarold Motto, PA-C

## 2023-06-24 NOTE — Patient Instructions (Signed)
It was great to see you!  Make sure you are taking dose of 75 mg trazodone nightly -- try taking every night to see if this helps  EKG looks fine  If symptom(s) return, please let me know  We will get blood work for further evaluation of your symptom(s) today  Take care,  Jarold Motto PA-C

## 2023-07-03 ENCOUNTER — Other Ambulatory Visit: Payer: Self-pay | Admitting: Physician Assistant

## 2023-07-31 ENCOUNTER — Telehealth: Payer: Self-pay | Admitting: Physician Assistant

## 2023-07-31 MED ORDER — WEGOVY 1 MG/0.5ML ~~LOC~~ SOAJ: 1.0000 mg | SUBCUTANEOUS | 0 refills | Status: DC

## 2023-07-31 NOTE — Telephone Encounter (Signed)
Pt would like to know if she can go up in dosage for [facility]. Please advise.

## 2023-07-31 NOTE — Telephone Encounter (Signed)
Spoke to pt told her okay to increase Wegovy to 1 mg weekly. I will send to pharmacy now for you. Pt verbalized understanding. Rx sent

## 2023-08-03 ENCOUNTER — Other Ambulatory Visit: Payer: Self-pay | Admitting: Physician Assistant

## 2023-08-17 ENCOUNTER — Other Ambulatory Visit: Payer: Self-pay | Admitting: Nurse Practitioner

## 2023-08-17 DIAGNOSIS — Z3041 Encounter for surveillance of contraceptive pills: Secondary | ICD-10-CM

## 2023-08-19 NOTE — Telephone Encounter (Signed)
Med refill request: Apri Last AEX: 08/07/22 Next AEX: not scheduled Last MMG (if hormonal med) n/a Refill authorized: Please Advise, #[phone], 0 RF, asked front to schedule

## 2023-08-19 NOTE — Telephone Encounter (Signed)
Patient left message. AEX scheduled for 11/26/22.

## 2023-09-02 ENCOUNTER — Telehealth: Payer: Self-pay | Admitting: Physician Assistant

## 2023-09-02 MED ORDER — WEGOVY 1 MG/0.5ML ~~LOC~~ SOAJ: 1.0000 mg | SUBCUTANEOUS | 0 refills | Status: DC

## 2023-09-02 NOTE — Telephone Encounter (Signed)
Prescription Request  09/02/2023  LOV: 06/24/2023  What is the name of the medication or equipment? Semaglutide-Weight Management (WEGOVY) 1 MG/0.5ML SOAJ   Have you contacted your pharmacy to request a refill? Yes   Which pharmacy would you like this sent to?   [facility] [address] [place], [place] - [address] [place] [address] Phone: [phone] Fax: [phone]    Patient notified that their request is being sent to the clinical staff for review and that they should receive a response within 2 business days.   Please advise at Mobile [phone] (mobile)

## 2023-09-02 NOTE — Telephone Encounter (Signed)
Request sent to requested pharmacy

## 2023-09-17 NOTE — Progress Notes (Signed)
Kelsey Curtis is a 32 y.o. female here for a new problem.  History of Present Illness:   No chief complaint on file.   HPI  UTI   Past Medical History:  Diagnosis Date   Pyelonephritis      Social History   Tobacco Use   Smoking status: Never    Passive exposure: Past   Smokeless tobacco: Never  Vaping Use   Vaping status: Never Used  Substance Use Topics   Alcohol use: Yes    Comment: occ   Drug use: No    Past Surgical History:  Procedure Laterality Date   NO PAST SURGERIES      Family History  Problem Relation Age of Onset   Hyperlipidemia Mother    Hyperlipidemia Father    Alzheimer's disease Maternal Grandmother    Colon cancer Neg Hx    Breast cancer Neg Hx    Esophageal cancer Neg Hx    Rectal cancer Neg Hx    Stomach cancer Neg Hx     No Known Allergies  Current Medications:   Current Outpatient Medications:    APRI 0.15-30 MG-MCG tablet, TAKE 1 TABLET BY MOUTH EVERY DAY, Disp: 84 tablet, Rfl: 0   pantoprazole (PROTONIX) 40 MG tablet, Take 1 tablet (40 mg total) by mouth daily., Disp: 30 tablet, Rfl: 11   Semaglutide-Weight Management (WEGOVY) 1 MG/0.5ML SOAJ, Inject 1 mg into the skin once a week., Disp: 2 mL, Rfl: 0   traZODone (DESYREL) 50 MG tablet, TAKE 1 AND 1/2 TABLETS (75 MG TOTAL) BY MOUTH AT BEDTIME AS NEEDED FOR SLEEP, Disp: 135 tablet, Rfl: 0   valACYclovir (VALTREX) 1000 MG tablet, Take two tablets ( total 2000 mg) by mouth q12h x 1 day; Start: ASAP after symptom onset, Disp: 30 tablet, Rfl: 1   Review of Systems:   ROS  Vitals:   There were no vitals filed for this visit.   There is no height or weight on file to calculate BMI.  Physical Exam:   Physical Exam  Assessment and Plan:   ***   I,Alexander Ruley,acting as a scribe for Jarold Motto, PA.,have documented all relevant documentation on the behalf of Jarold Motto, PA,as directed by  Jarold Motto, PA while in the presence of Jarold Motto,  [place].   ***   Jarold Motto, PA-C

## 2023-09-18 ENCOUNTER — Ambulatory Visit (INDEPENDENT_AMBULATORY_CARE_PROVIDER_SITE_OTHER): Payer: 59 | Admitting: Physician Assistant

## 2023-09-18 VITALS — BP 110/76 | HR 89 | Temp 97.3°F | Ht 65.0 in | Wt 221.5 lb

## 2023-09-18 DIAGNOSIS — R300 Dysuria: Secondary | ICD-10-CM | POA: Diagnosis not present

## 2023-09-18 DIAGNOSIS — F419 Anxiety disorder, unspecified: Secondary | ICD-10-CM

## 2023-09-18 DIAGNOSIS — E669 Obesity, unspecified: Secondary | ICD-10-CM | POA: Diagnosis not present

## 2023-09-18 DIAGNOSIS — L84 Corns and callosities: Secondary | ICD-10-CM | POA: Diagnosis not present

## 2023-09-18 LAB — POCT URINALYSIS DIPSTICK
Bilirubin, UA: NEGATIVE
Blood, UA: NEGATIVE
Glucose, UA: NEGATIVE
Ketones, UA: NEGATIVE
Nitrite, UA: NEGATIVE
Protein, UA: POSITIVE — AB
Spec Grav, UA: 1.01 (ref 1.010–1.025)
Urobilinogen, UA: 1 U/dL
pH, UA: 7.5 (ref 5.0–8.0)

## 2023-09-18 MED ORDER — CEPHALEXIN 500 MG PO CAPS
500.0000 mg | ORAL_CAPSULE | Freq: Two times a day (BID) | ORAL | 0 refills | Status: DC
Start: 2023-09-18 — End: 2023-11-27

## 2023-09-18 NOTE — Patient Instructions (Signed)
It was great to see you!  Start Keflex 500 mg twice daily  Use pumice stone nightly to soften the corn -- use for 2-3 minutes and moisturize area well Use corn cushion during the day while on your feet  Continue Wegovy at 1 mg weekly  Take care,  Jarold Motto PA-C

## 2023-09-20 LAB — URINE CULTURE
MICRO NUMBER:: 15664035
SPECIMEN QUALITY:: ADEQUATE

## 2023-09-30 ENCOUNTER — Other Ambulatory Visit: Payer: Self-pay | Admitting: Gastroenterology

## 2023-11-07 ENCOUNTER — Other Ambulatory Visit: Payer: Self-pay | Admitting: Nurse Practitioner

## 2023-11-07 DIAGNOSIS — Z3041 Encounter for surveillance of contraceptive pills: Secondary | ICD-10-CM

## 2023-11-07 NOTE — Telephone Encounter (Signed)
Med refill request: Apri Last AEX: 08/07/22 Next AEX: 11/27/23 Last MMG (if hormonal med) n/a Refill authorized: Please Advise, #84, 0 RF

## 2023-11-08 ENCOUNTER — Other Ambulatory Visit: Payer: Self-pay

## 2023-11-08 NOTE — Telephone Encounter (Signed)
Note made in error

## 2023-11-27 ENCOUNTER — Ambulatory Visit (INDEPENDENT_AMBULATORY_CARE_PROVIDER_SITE_OTHER): Payer: 59 | Admitting: Nurse Practitioner

## 2023-11-27 ENCOUNTER — Encounter: Payer: Self-pay | Admitting: Nurse Practitioner

## 2023-11-27 VITALS — BP 110/70 | HR 78 | Ht 64.0 in | Wt 223.0 lb

## 2023-11-27 DIAGNOSIS — Z3041 Encounter for surveillance of contraceptive pills: Secondary | ICD-10-CM | POA: Diagnosis not present

## 2023-11-27 DIAGNOSIS — Z124 Encounter for screening for malignant neoplasm of cervix: Secondary | ICD-10-CM

## 2023-11-27 DIAGNOSIS — Z01419 Encounter for gynecological examination (general) (routine) without abnormal findings: Secondary | ICD-10-CM

## 2023-11-27 MED ORDER — APRI 0.15-30 MG-MCG PO TABS: 1.0000 | ORAL_TABLET | Freq: Every day | ORAL | 3 refills | Status: DC

## 2023-11-27 NOTE — Progress Notes (Signed)
   Kelsey Curtis 1991-10-09 [id number]   History:  33 y.o. G0 presents for annual exam. Monthly cycles. Normal pap history.   Gynecologic History Patient's last menstrual period was 11/14/2023. Period Cycle (Days): 28 Period Duration (Days): 3 Period Pattern: Regular Menstrual Flow: Light Menstrual Control: Tampon, Thin pad Menstrual Control Change Freq (Hours): 4-6 Dysmenorrhea: (!) Mild Dysmenorrhea Symptoms: Cramping Contraception/Family planning: OCP (estrogen/progesterone) Sexually active: Yes  Health Maintenance Last Pap: 03/2021 per patient. Results were: Normal Last mammogram: 03/03/2021 per patient. Results were: Normal Last colonoscopy: Not indicated Last Dexa: Not indicated  Past medical history, past surgical history, family history and social history were all reviewed and documented in the EPIC chart. Married. Works for [employer]. Has 73 yo stepson.   ROS:  A ROS was performed and pertinent positives and negatives are included.  Exam:  Vitals:   11/27/23 1436  BP: 110/70  Pulse: 78  SpO2: 100%  Weight: 223 lb (101.2 kg)  Height: 5' 4 (1.626 m)    Body mass index is 38.28 kg/m.  General appearance:  Normal Thyroid :  Symmetrical, normal in size, without palpable masses or nodularity. Respiratory  Auscultation:  Clear without wheezing or rhonchi Cardiovascular  Auscultation:  Regular rate, without rubs, murmurs or gallops  Edema/varicosities:  Not grossly evident Abdominal  Soft,nontender, without masses, guarding or rebound.  Liver/spleen:  No organomegaly noted  Hernia:  None appreciated  Skin  Inspection:  Grossly normal Breasts: Examined lying and sitting.   Right: Without masses, retractions, nipple discharge or axillary adenopathy.   Left: Without masses, retractions, nipple discharge or axillary adenopathy. Pelvic: External genitalia:  no lesions              Urethra:  normal appearing urethra with no masses, tenderness or lesions               Bartholins and Skenes: normal                 Vagina: normal appearing vagina with normal color and discharge, no lesions              Cervix: no lesions Bimanual Exam:  Uterus:  no masses or tenderness              Adnexa: no mass, fullness, tenderness              Rectovaginal: Deferred              Anus:  normal, no lesions  Patient informed chaperone available to be present for breast and pelvic exam. Patient has requested no chaperone to be present. Patient has been advised what will be completed during breast and pelvic exam.   Assessment/Plan:  33 y.o. G0 for annual exam.   Well female exam with routine gynecological exam - Education provided on SBEs, importance of preventative screenings, current guidelines, high calcium diet, regular exercise, and multivitamin daily.  Labs with PCP.   Encounter for surveillance of contraceptive pills - Plan: APRI  0.15-30 MG-MCG tablet daily. Takes as prescribed. Refill x 1 year provided.   Cervical cancer screening - Plan: Cytology - PAP( [facility]). Normal Pap history.  Will repeat at 3-year interval per guidelines..   Return in about 1 year (around 11/26/2024) for Annual.     Kelsey DELENA Shutter DNP, 2:55 PM 11/27/2023

## 2023-12-07 IMAGING — CT CT RENAL STONE PROTOCOL
2 of 4 series · 16 of 46 positions shown, 18 images · non-contrast
Comparison: None.

CLINICAL DATA: Flank pain, kidney stones suspected



[Series 2: stone full · axial · 0.73mm/px · z∈[+751,+1176]mm · 13 of 93 slices shown, 15 images]
[im 4/93  soft-tissue]
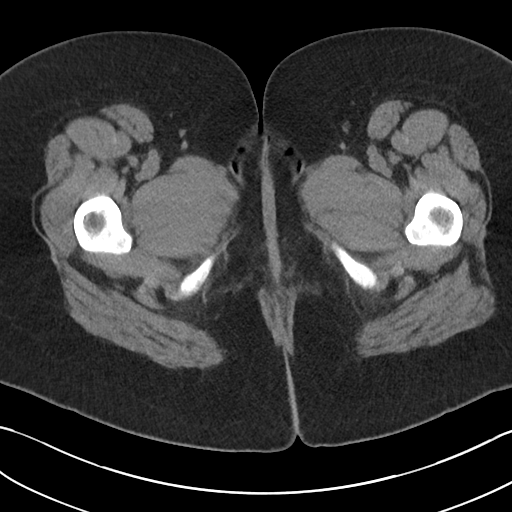
[im 4/93  bone]
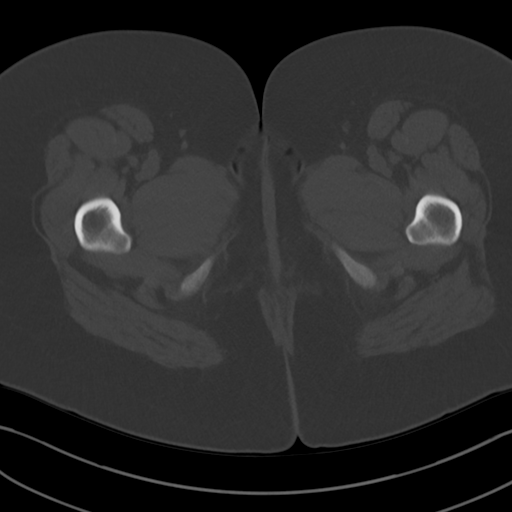
[im 12/93  soft-tissue]
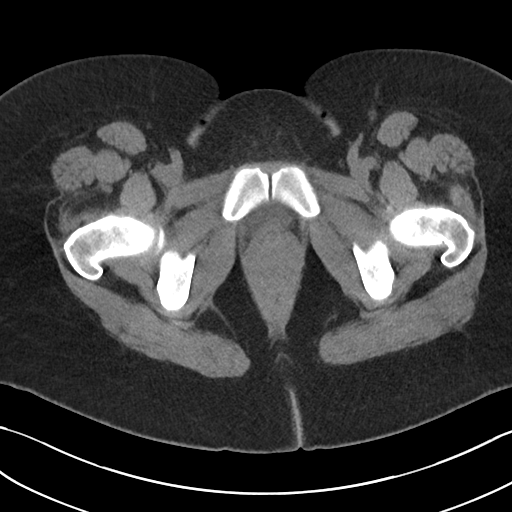
[im 20/93  soft-tissue]
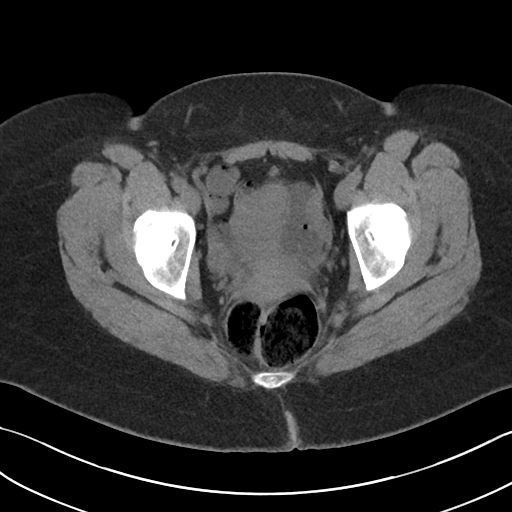
[im 27/93  soft-tissue]
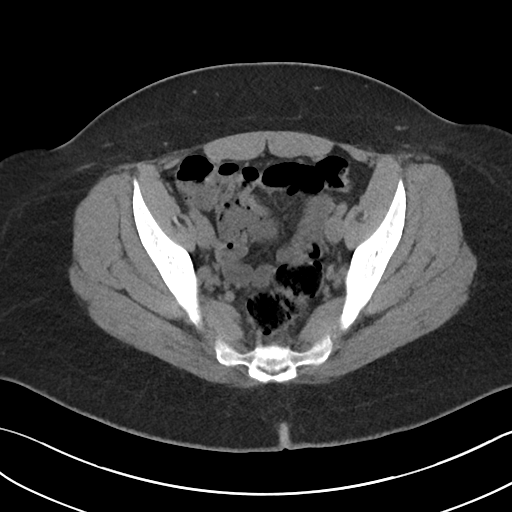
[im 31/93  soft-tissue]
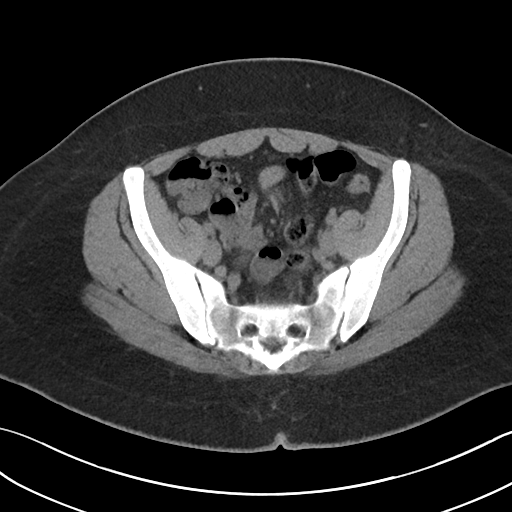
[im 39/93  soft-tissue]
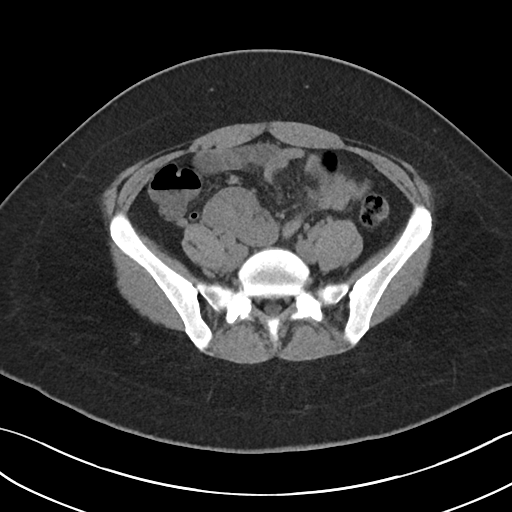
[im 47/93  soft-tissue]
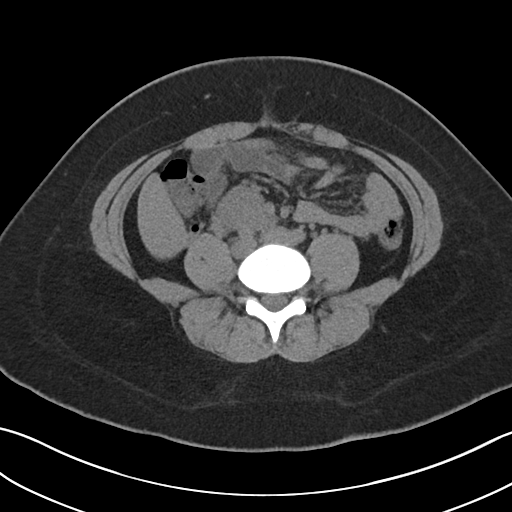
[im 54/93  soft-tissue]
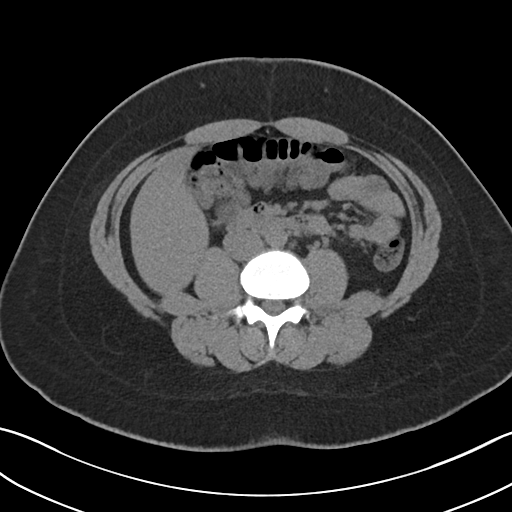
[im 62/93  soft-tissue]
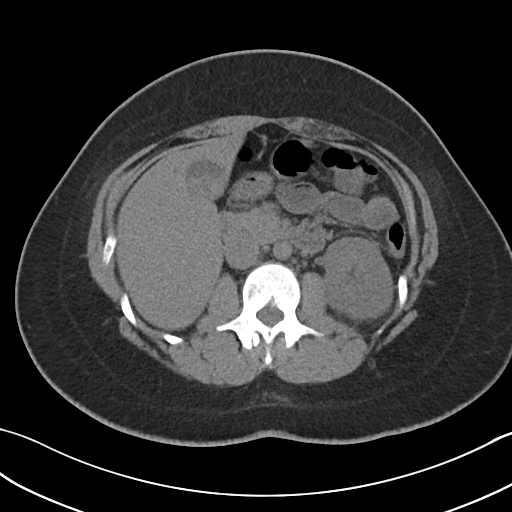
[im 62/93  bone]
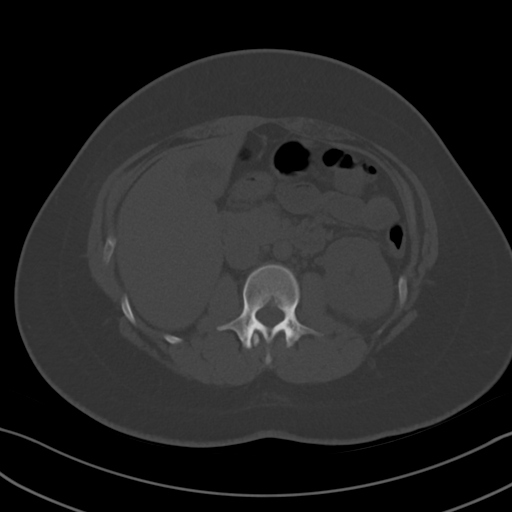
[im 66/93  soft-tissue]
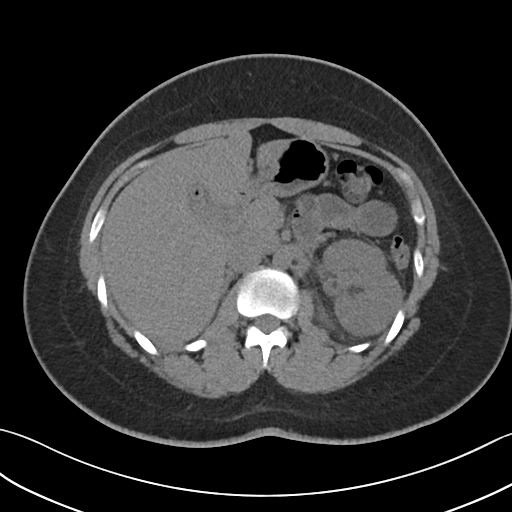
[im 73/93  soft-tissue]
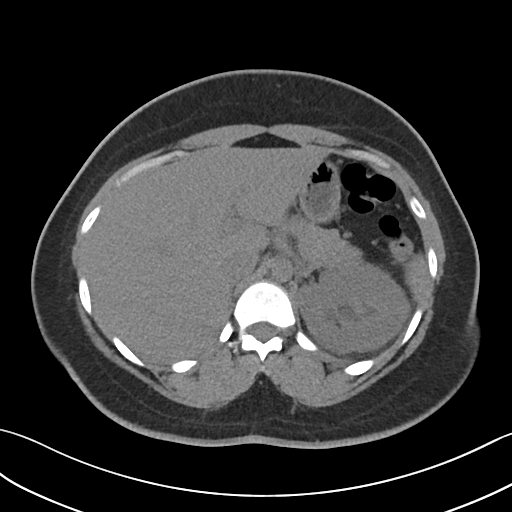
[im 81/93  soft-tissue]
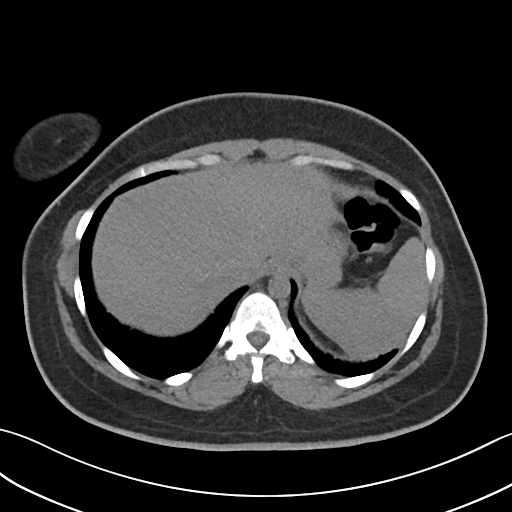
[im 89/93  soft-tissue]
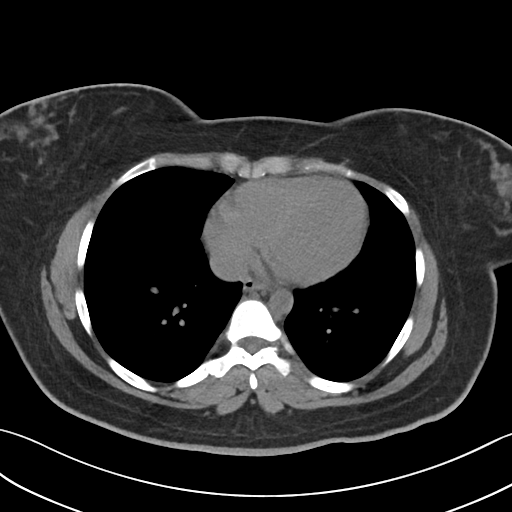

[Series 5: coronal · coronal · 0.74mm/px · 3 of 102 slices shown]
[im 34/102  soft-tissue]
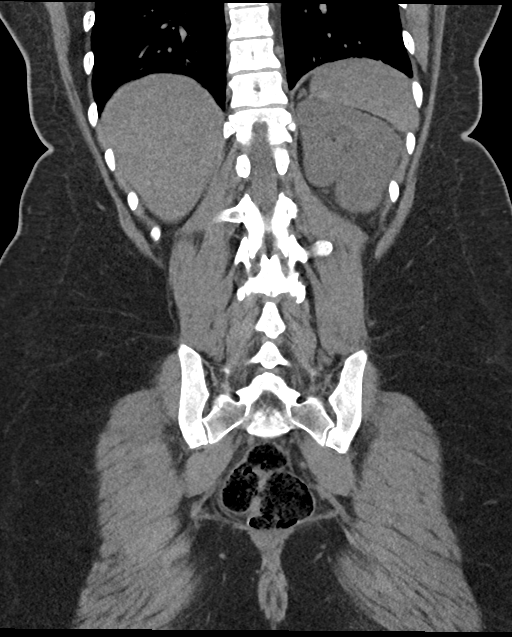
[im 45/102  soft-tissue]
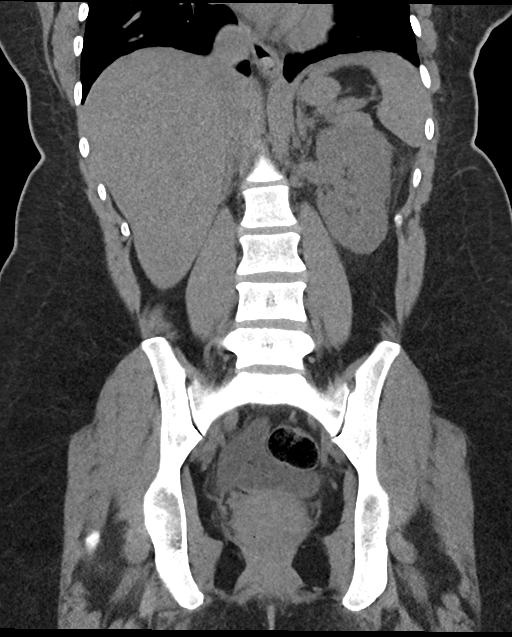
[im 57/102  soft-tissue]
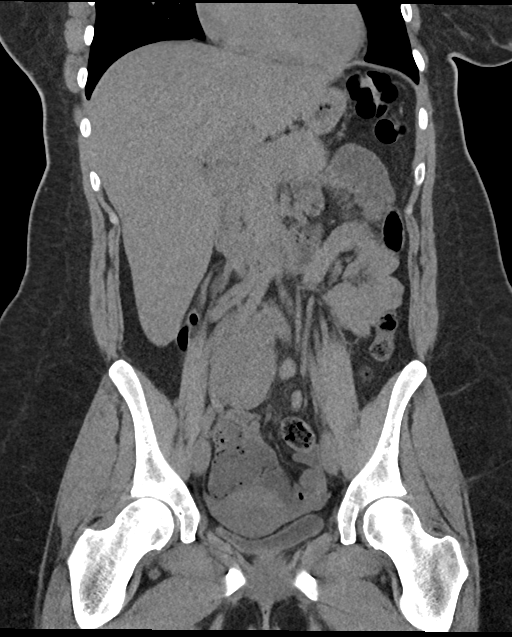

[16 of 46 positions shown; findings below may reference images not displayed]

FINDINGS: Lower chest: No acute abnormality.

Hepatobiliary: Normal hepatic contour and morphology. No discrete
hepatic lesion. Multiple gallstones are present within the
gallbladder lumen. Several of the stones contain internal gas likely
reflecting nitrogen.

Pancreas: Unremarkable. No pancreatic ductal dilatation or
surrounding inflammatory changes.

Spleen: Normal in size without focal abnormality.

Adrenals/Urinary Tract: Normal adrenal glands. Ectopic right pelvic
kidney is relatively small in size. There is compensatory
hypertrophy of the left kidney. No evidence of hydronephrosis.
Possible tiny 1 mm stone in the interpolar collecting system.
Unremarkable ureter and bladder. No stones visualized.

Stomach/Bowel: No evidence of obstruction or focal bowel wall
thickening. Normal appendix in the right lower quadrant. The
terminal ileum is unremarkable.

Vascular/Lymphatic: Limited evaluation in the absence of intravenous
contrast. No atherosclerotic plaque or aneurysm. No suspicious
lymphadenopathy.

Reproductive: Uterus and bilateral adnexa are unremarkable.

Other: No abdominal wall hernia or abnormality. No abdominopelvic
ascites.

Musculoskeletal: No acute or significant osseous findings.
IMPRESSION: 1. Ectopic pelvic kidney on the right.
2. Compensatory hypertrophy of the left kidney with a possible tiny
1 mm stone in the interpolar collecting system. No evidence of
hydronephrosis.
3. Cholelithiasis.

## 2023-12-12 ENCOUNTER — Ambulatory Visit (INDEPENDENT_AMBULATORY_CARE_PROVIDER_SITE_OTHER): Payer: 59 | Admitting: Nurse Practitioner

## 2023-12-12 ENCOUNTER — Other Ambulatory Visit (HOSPITAL_COMMUNITY)
Admission: RE | Admit: 2023-12-12 | Discharge: 2023-12-12 | Disposition: A | Discharge status: Home / Self Care | Payer: 59 | Source: Ambulatory Visit | Attending: Nurse Practitioner | Admitting: Nurse Practitioner

## 2023-12-12 VITALS — BP 124/80 | HR 89 | Wt 222.0 lb

## 2023-12-12 DIAGNOSIS — Z124 Encounter for screening for malignant neoplasm of cervix: Secondary | ICD-10-CM | POA: Diagnosis present

## 2023-12-12 NOTE — Progress Notes (Signed)
   Acute Office Visit  Subjective:    Patient ID: Kelsey Curtis, female    DOB: 1991-07-03, 33 y.o.   MRN: [id number]   HPI 33 y.o. presents today for pap. Annual 11/27/23, pap collected but lost in transit. Repeating today.   Patient's last menstrual period was 12/05/2023.    Review of Systems  Constitutional: Negative.   Genitourinary: Negative.        Objective:    Physical Exam Constitutional:      Appearance: Normal appearance.  Genitourinary:    General: Normal vulva.     Vagina: Normal.     Cervix: Normal.     BP 124/80   Pulse 89   Wt 222 lb (100.7 kg)   LMP 12/05/2023   SpO2 100%   BMI 38.11 kg/m  Wt Readings from Last 3 Encounters:  12/12/23 222 lb (100.7 kg)  11/27/23 223 lb (101.2 kg)  09/18/23 221 lb 8 oz (100.5 kg)        Patient informed chaperone available to be present for breast and/or pelvic exam. Patient has requested no chaperone to be present. Patient has been advised what will be completed during breast and pelvic exam.   Assessment & Plan:   Problem List Items Addressed This Visit   None Visit Diagnoses       Cervical cancer screening    -  Primary   Relevant Orders   Cytology - PAP( [facility])      Plan: Pap pending.      Olivia Mackie DNP, 11:03 AM 12/12/2023

## 2023-12-17 ENCOUNTER — Encounter: Payer: Self-pay | Admitting: Nurse Practitioner

## 2023-12-17 LAB — CYTOLOGY - PAP
Comment: NEGATIVE
Diagnosis: UNDETERMINED — AB
High risk HPV: NEGATIVE

## 2024-01-03 ENCOUNTER — Ambulatory Visit (INDEPENDENT_AMBULATORY_CARE_PROVIDER_SITE_OTHER): Payer: 59 | Admitting: Physician Assistant

## 2024-01-03 ENCOUNTER — Encounter: Payer: Self-pay | Admitting: Physician Assistant

## 2024-01-03 VITALS — BP 120/68 | HR 69 | Temp 97.2°F | Ht 64.0 in | Wt 225.0 lb

## 2024-01-03 DIAGNOSIS — R350 Frequency of micturition: Secondary | ICD-10-CM

## 2024-01-03 DIAGNOSIS — R1011 Right upper quadrant pain: Secondary | ICD-10-CM | POA: Diagnosis not present

## 2024-01-03 LAB — CBC WITH DIFFERENTIAL/PLATELET
Basophils Absolute: 0 10*3/uL (ref 0.0–0.1)
Basophils Relative: 0.2 % (ref 0.0–3.0)
Eosinophils Absolute: 0.1 10*3/uL (ref 0.0–0.7)
Eosinophils Relative: 1.1 % (ref 0.0–5.0)
HCT: 40.7 % (ref 36.0–46.0)
Hemoglobin: 13.3 g/dL (ref 12.0–15.0)
Lymphocytes Relative: 32.1 % (ref 12.0–46.0)
Lymphs Abs: 2.6 10*3/uL (ref 0.7–4.0)
MCHC: 32.7 g/dL (ref 30.0–36.0)
MCV: 87.6 fL (ref 78.0–100.0)
Monocytes Absolute: 0.5 10*3/uL (ref 0.1–1.0)
Monocytes Relative: 6.8 % (ref 3.0–12.0)
Neutro Abs: 4.8 10*3/uL (ref 1.4–7.7)
Neutrophils Relative %: 59.8 % (ref 43.0–77.0)
Platelets: 288 10*3/uL (ref 150.0–400.0)
RBC: 4.65 Mil/uL (ref 3.87–5.11)
RDW: 13.3 % (ref 11.5–15.5)
WBC: 8 10*3/uL (ref 4.0–10.5)

## 2024-01-03 LAB — POCT URINALYSIS DIPSTICK
Bilirubin, UA: NEGATIVE
Blood, UA: NEGATIVE
Glucose, UA: NEGATIVE
Ketones, UA: NEGATIVE
Leukocytes, UA: NEGATIVE
Nitrite, UA: NEGATIVE
Protein, UA: NEGATIVE
Spec Grav, UA: 1.02 (ref 1.010–1.025)
Urobilinogen, UA: 0.2 U/dL
pH, UA: 6.5 (ref 5.0–8.0)

## 2024-01-03 LAB — COMPREHENSIVE METABOLIC PANEL
ALT: 13 U/L (ref 0–35)
AST: 13 U/L (ref 0–37)
Albumin: 3.6 g/dL (ref 3.5–5.2)
Alkaline Phosphatase: 62 U/L (ref 39–117)
BUN: 10 mg/dL (ref 6–23)
CO2: 26 meq/L (ref 19–32)
Calcium: 8.5 mg/dL (ref 8.4–10.5)
Chloride: 104 meq/L (ref 96–112)
Creatinine, Ser: 0.8 mg/dL (ref 0.40–1.20)
GFR: 96.65 mL/min (ref >60.00)
Glucose, Bld: 83 mg/dL (ref 70–99)
Potassium: 4 meq/L (ref 3.5–5.1)
Sodium: 137 meq/L (ref 135–145)
Total Bilirubin: 0.5 mg/dL (ref 0.2–1.2)
Total Protein: 6.9 g/dL (ref 6.0–8.3)

## 2024-01-03 LAB — LIPASE: Lipase: 8 U/L — ABNORMAL LOW (ref 11.0–59.0)

## 2024-01-03 LAB — HCG, QUANTITATIVE, PREGNANCY: Quantitative HCG: <0.6 m[IU]/mL

## 2024-01-03 NOTE — Patient Instructions (Signed)
It was great to see you!  We will order labs and ultrasound for further evaluation  Someone from [facility] will call you to schedule this  If any worsening symptom(s) in the meantime, please let us know   Take care,  Jarold Motto PA-C

## 2024-01-03 NOTE — Progress Notes (Signed)
 Kelsey Curtis is a 33 y.o. female here for a new problem.  History of Present Illness:   Chief Complaint  Patient presents with   Urinary Frequency    Pt c/o urinary frequency x 3 weeks, mild low back pain. Denies vaginal discharge.   GI Problem    Pt c/o feeling nauseous, gassy, slight abdominal pain x several weeks.    HPI  Urinary frequency: Pt complains of urinary frequency ongoing for 3 weeks.  Endorses associated mid-low back pain.  She reports her latest pap came back abnormal.  Denies any vaginal discharge or bleeding.  Does not have a hx of yeast infections.   GI upset: She has been experiencing persistent nausea, slight abdominal pain, and gas for about 3 weeks.  Pt states everything she eats tends to trigger symptoms.  Nausea occurs sporadically, whether she has eaten or not. She believes this is separate than her urinary sx.  Still having regular bowel movements.  Denies any vomiting. Was previously on Wegovy, stopped 09/2023 before Thanksgiving.  Taking pantoprazole for a year now - has not helped her sx recently.  Has a family history of GI and gallbladder issues, dad and sister.    Past Medical History:  Diagnosis Date   Pyelonephritis      Social History   Tobacco Use   Smoking status: Never    Passive exposure: Past   Smokeless tobacco: Never  Vaping Use   Vaping status: Never Used  Substance Use Topics   Alcohol use: Yes    Comment: occ   Drug use: No    Past Surgical History:  Procedure Laterality Date   NO PAST SURGERIES      Family History  Problem Relation Age of Onset   Hyperlipidemia Mother    Hyperlipidemia Father    Alzheimer's disease Maternal Grandmother    Colon cancer Neg Hx    Breast cancer Neg Hx    Esophageal cancer Neg Hx    Rectal cancer Neg Hx    Stomach cancer Neg Hx     No Known Allergies  Current Medications:   Current Outpatient Medications:    APRI 0.15-30 MG-MCG tablet, Take 1 tablet by mouth daily.,  Disp: 84 tablet, Rfl: 3   pantoprazole (PROTONIX) 40 MG tablet, TAKE 1 TABLET BY MOUTH EVERY DAY, Disp: 90 tablet, Rfl: 0   traZODone (DESYREL) 50 MG tablet, TAKE 1 AND 1/2 TABLETS (75 MG TOTAL) BY MOUTH AT BEDTIME AS NEEDED FOR SLEEP, Disp: 135 tablet, Rfl: 0   valACYclovir (VALTREX) 1000 MG tablet, Take two tablets ( total 2000 mg) by mouth q12h x 1 day; Start: ASAP after symptom onset, Disp: 30 tablet, Rfl: 1   Review of Systems:   Negative unless otherwise specified per HPI.  Vitals:   Vitals:   01/03/24 0821  BP: 120/68  Pulse: 69  Temp: (!) 97.2 F (36.2 C)  TempSrc: Temporal  SpO2: 99%  Weight: 225 lb (102.1 kg)  Height: 5\' 4"  (1.626 m)     Body mass index is 38.62 kg/m.  Physical Exam:   Physical Exam Vitals and nursing note reviewed.  Constitutional:      General: She is not in acute distress.    Appearance: She is well-developed. She is not ill-appearing or toxic-appearing.  Cardiovascular:     Rate and Rhythm: Normal rate and regular rhythm.     Pulses: Normal pulses.     Heart sounds: Normal heart sounds, S1 normal and S2 normal.  Pulmonary:  Effort: Pulmonary effort is normal.     Breath sounds: Normal breath sounds.  Abdominal:     General: Abdomen is flat. Bowel sounds are normal.     Palpations: Abdomen is soft.     Tenderness: There is abdominal tenderness in the right upper quadrant. There is no right CVA tenderness, left CVA tenderness, guarding or rebound.  Skin:    General: Skin is warm and dry.  Neurological:     Mental Status: She is alert.     GCS: GCS eye subscore is 4. GCS verbal subscore is 5. GCS motor subscore is 6.  Psychiatric:        Speech: Speech normal.        Behavior: Behavior normal. Behavior is cooperative.    Results for orders placed or performed in visit on 01/03/24  POCT urinalysis dipstick  Result Value Ref Range   Color, UA amber    Clarity, UA clear    Glucose, UA Negative Negative   Bilirubin, UA Negative     Ketones, UA Negative    Spec Grav, UA 1.020 1.010 - 1.025   Blood, UA Negative    pH, UA 6.5 5.0 - 8.0   Protein, UA Negative Negative   Urobilinogen, UA 0.2 0.2 or 1.0 E.U./dL   Nitrite, UA Negative    Leukocytes, UA Negative Negative   Appearance     Odor       Assessment and Plan:   Urinary frequency UA unremarkable No indication for antibiotic(s) at this time Will order culture and if abnormal, will add on treatment Declined vaginal swab  RUQ pain Unclear etiology Will check beta quant She is concerned about gallbladder and is requesting ultrasound Update blood work to rule out organic cause and order ultrasound Recommend avoiding constipation If new/worsening symptom(s), will ask her to reach out and we can change order to stat if indicated   I, Isabelle Course, acting as a Neurosurgeon for [employer], [place]., have documented all relevant documentation on the behalf of Jarold Motto, [place], as directed by  Jarold Motto, PA while in the presence of Jarold Motto, [place].  I, Isabelle Course, have reviewed all documentation for this visit. The documentation on 01/03/24 for the exam, diagnosis, procedures, and orders are all accurate and complete.  Jarold Motto, PA-C

## 2024-01-04 LAB — URINE CULTURE
MICRO NUMBER:: 16086281
SPECIMEN QUALITY:: ADEQUATE

## 2024-01-06 ENCOUNTER — Ambulatory Visit
Admission: RE | Admit: 2024-01-06 | Discharge: 2024-01-06 | Disposition: A | Discharge status: Home / Self Care | Payer: 59 | Source: Ambulatory Visit | Attending: Physician Assistant

## 2024-01-06 DIAGNOSIS — R1011 Right upper quadrant pain: Secondary | ICD-10-CM

## 2024-01-07 ENCOUNTER — Encounter: Payer: Self-pay | Admitting: Physician Assistant

## 2024-01-07 ENCOUNTER — Other Ambulatory Visit: Payer: Self-pay | Admitting: Physician Assistant

## 2024-01-07 DIAGNOSIS — K8020 Calculus of gallbladder without cholecystitis without obstruction: Secondary | ICD-10-CM

## 2024-01-07 DIAGNOSIS — R1011 Right upper quadrant pain: Secondary | ICD-10-CM

## 2024-01-07 NOTE — Telephone Encounter (Signed)
 See other message

## 2024-01-20 ENCOUNTER — Other Ambulatory Visit: Payer: Self-pay | Admitting: General Surgery

## 2024-01-20 DIAGNOSIS — R1084 Generalized abdominal pain: Secondary | ICD-10-CM

## 2024-01-23 ENCOUNTER — Telehealth: Payer: Self-pay

## 2024-01-23 ENCOUNTER — Other Ambulatory Visit: Payer: Self-pay | Admitting: Nurse Practitioner

## 2024-01-23 DIAGNOSIS — Z3041 Encounter for surveillance of contraceptive pills: Secondary | ICD-10-CM

## 2024-01-23 NOTE — Telephone Encounter (Signed)
 Received pt refill request for OCP. Advised pt to call pharmacy as TW has refilled for an entire year. Pt was agreeable

## 2024-02-12 ENCOUNTER — Other Ambulatory Visit

## 2024-02-12 ENCOUNTER — Ambulatory Visit
Admission: RE | Admit: 2024-02-12 | Discharge: 2024-02-12 | Disposition: A | Discharge status: Home / Self Care | Source: Ambulatory Visit | Attending: General Surgery | Admitting: General Surgery

## 2024-02-12 DIAGNOSIS — R1084 Generalized abdominal pain: Secondary | ICD-10-CM

## 2024-02-12 MED ORDER — IOPAMIDOL (ISOVUE-300) INJECTION 61%
100.0000 mL | Freq: Once | INTRAVENOUS | Status: AC | PRN
  Administered 2024-02-12: 100 mL via INTRAVENOUS

## 2024-09-30 ENCOUNTER — Other Ambulatory Visit: Payer: Self-pay | Admitting: Physician Assistant

## 2024-10-01 ENCOUNTER — Other Ambulatory Visit: Payer: Self-pay | Admitting: *Deleted

## 2024-10-01 MED ORDER — TRAZODONE HCL 50 MG PO TABS: 75.0000 mg | ORAL_TABLET | Freq: Every evening | ORAL | 0 refills | Status: AC | PRN

## 2024-10-14 ENCOUNTER — Other Ambulatory Visit: Payer: Self-pay | Admitting: Nurse Practitioner

## 2024-10-14 DIAGNOSIS — Z3041 Encounter for surveillance of contraceptive pills: Secondary | ICD-10-CM

## 2024-10-14 NOTE — Telephone Encounter (Signed)
 Med refill request: Apri   Last AEX: 11/27/23 Repeat pap: 12/12/23 Next AEX: none scheduled MSG sent to FD to schedule AEX Last MMG (if hormonal med) n/a Refill authorized: Apri  #84 no refills

## 2024-10-19 ENCOUNTER — Ambulatory Visit: Admitting: Physician Assistant

## 2024-10-19 ENCOUNTER — Encounter: Payer: Self-pay | Admitting: Physician Assistant

## 2024-10-19 VITALS — BP 126/70 | HR 68 | Temp 97.4°F | Ht 64.0 in | Wt 256.0 lb

## 2024-10-19 DIAGNOSIS — E669 Obesity, unspecified: Secondary | ICD-10-CM

## 2024-10-19 DIAGNOSIS — F419 Anxiety disorder, unspecified: Secondary | ICD-10-CM

## 2024-10-19 DIAGNOSIS — E785 Hyperlipidemia, unspecified: Secondary | ICD-10-CM

## 2024-10-19 DIAGNOSIS — Z0000 Encounter for general adult medical examination without abnormal findings: Secondary | ICD-10-CM

## 2024-10-19 DIAGNOSIS — Z23 Encounter for immunization: Secondary | ICD-10-CM

## 2024-10-19 DIAGNOSIS — F5101 Primary insomnia: Secondary | ICD-10-CM

## 2024-10-19 NOTE — Progress Notes (Signed)
 Subjective:    Kelsey Curtis is a 33 y.o. female and is here for a comprehensive physical exam.  HPI  There are no preventive care reminders to display for this patient.  Discussed the use of AI scribe software for clinical note transcription with the patient, who gave verbal consent to proceed.  History of Present Illness   Kelsey Curtis is a 33 year old female who presents for evaluation of elevated blood pressure and weight management.  She noted an elevated home blood pressure of 174 mmHg a couple of weeks ago while checking her husband's blood pressure. Since then her home readings have improved, including 130/78 mmHg last Wednesday, but she remains concerned about elevated blood pressure.  She has progressive weight gain and is concerned about elevated cholesterol from her last visit. She previously used Wegovy  but stopped in November due to gastrointestinal side effects. She does not exercise and drinks only one to two glasses of water daily.  She has chronic sleep disturbance with frequent nighttime awakenings despite taking trazodone , currently one and a half tablets, which she has used for about a year but not consistently. She links poor sleep to high anxiety and possible stress eating.  Both parents have high cholesterol. She does not drink alcohol regularly. She has not had a recent eye exam but has no vision complaints.      Health Maintenance: Immunizations -- UpToDate  Colonoscopy -- N/A  Mammogram -- N/A  PAP -- UpToDate  Bone Density -- N/A  Diet -- working on healthier eating Exercise -- limited  Sleep habits -- see above Mood -- stable  UTD with dentist? - yes UTD with eye doctor? - yes  Weight history: Wt Readings from Last 10 Encounters:  10/19/24 256 lb (116.1 kg)  01/03/24 225 lb (102.1 kg)  12/12/23 222 lb (100.7 kg)  11/27/23 223 lb (101.2 kg)  09/18/23 221 lb 8 oz (100.5 kg)  06/24/23 231 lb (104.8 kg)  02/25/23 237 lb 6.1 oz  (107.7 kg)  01/03/23 241 lb 12.8 oz (109.7 kg)  10/16/22 234 lb (106.1 kg)  09/19/22 234 lb 3.2 oz (106.2 kg)   Body mass index is 43.94 kg/m. Patient's last menstrual period was 10/16/2024 (exact date).  Alcohol use:  reports current alcohol use.  Tobacco use:  Tobacco Use: Low Risk  (10/19/2024)   Patient History    Smoking Tobacco Use: Never    Smokeless Tobacco Use: Never    Passive Exposure: Past   Eligible for lung cancer screening? no     11/27/2023    2:41 PM  Depression screen PHQ 2/9  Decreased Interest 0  Down, Depressed, Hopeless 0  PHQ - 2 Score 0     Other providers/specialists: Patient Care Team: Job Lukes, [place] as PCP - General (Physician Assistant) Prentiss Annabella LABOR, NP as Nurse Practitioner (Gynecology)    PMHx, SurgHx, SocialHx, Medications, and Allergies were reviewed in the Visit Navigator and updated as appropriate.   Past Medical History:  Diagnosis Date   Pyelonephritis      Past Surgical History:  Procedure Laterality Date   NO PAST SURGERIES       Family History  Problem Relation Age of Onset   Hyperlipidemia Mother    Hyperlipidemia Father    Alzheimer's disease Maternal Grandmother    Colon cancer Neg Hx    Breast cancer Neg Hx    Esophageal cancer Neg Hx    Rectal cancer Neg Hx    Stomach  cancer Neg Hx     Social History   Tobacco Use   Smoking status: Never    Passive exposure: Past   Smokeless tobacco: Never  Vaping Use   Vaping status: Never Used  Substance Use Topics   Alcohol use: Yes    Comment: occ   Drug use: No    Review of Systems:   Review of Systems  Constitutional:  Negative for chills, fever, malaise/fatigue and weight loss.  HENT:  Negative for hearing loss, sinus pain and sore throat.   Respiratory:  Negative for cough and hemoptysis.   Cardiovascular:  Negative for chest pain, palpitations, leg swelling and PND.  Gastrointestinal:  Negative for abdominal pain, constipation, diarrhea,  heartburn, nausea and vomiting.  Genitourinary:  Negative for dysuria, frequency and urgency.  Musculoskeletal:  Negative for back pain, myalgias and neck pain.  Skin:  Negative for itching and rash.  Neurological:  Negative for dizziness, tingling, seizures and headaches.  Endo/Heme/Allergies:  Negative for polydipsia.  Psychiatric/Behavioral:  Negative for depression. The patient has insomnia. The patient is not nervous/anxious.     Objective:   BP 126/70 (BP Location: Left Arm, Patient Position: Sitting, Cuff Size: Large)   Pulse 68   Temp (!) 97.4 F (36.3 C) (Temporal)   Ht 5' 4 (1.626 m)   Wt 256 lb (116.1 kg)   LMP 10/16/2024 (Exact Date)   BMI 43.94 kg/m  Body mass index is 43.94 kg/m.   General Appearance:    Alert, cooperative, no distress, appears stated age  Head:    Normocephalic, without obvious abnormality, atraumatic  Eyes:    PERRL, conjunctiva/corneas clear, EOM's intact, fundi    benign, both eyes  Ears:    Normal TM's and external ear canals, both ears  Nose:   Nares normal, septum midline, mucosa normal, no drainage    or sinus tenderness  Throat:   Lips, mucosa, and tongue normal; teeth and gums normal  Neck:   Supple, symmetrical, trachea midline, no adenopathy;    thyroid :  no enlargement/tenderness/nodules; no carotid   bruit or JVD  Back:     Symmetric, no curvature, ROM normal, no CVA tenderness  Lungs:     Clear to auscultation bilaterally, respirations unlabored  Chest Wall:    No tenderness or deformity   Heart:    Regular rate and rhythm, S1 and S2 normal, no murmur, rub or gallop  Breast Exam:    Deferred  Abdomen:     Soft, non-tender, bowel sounds active all four quadrants,    no masses, no organomegaly  Genitalia:    Deferred  Extremities:   Extremities normal, atraumatic, no cyanosis or edema  Pulses:   2+ and symmetric all extremities  Skin:   Skin color, texture, turgor normal, no rashes or lesions  Lymph nodes:   Cervical,  supraclavicular, and axillary nodes normal  Neurologic:   CNII-XII intact, normal strength, sensation and reflexes    throughout    Assessment/Plan:   Assessment and Plan    Comprehensive Physical Exam (CPE) preventive care annual visit Today patient counseled on age appropriate routine health concerns for screening and prevention, each reviewed and up to date or declined. Immunizations reviewed and up to date or declined. Labs ordered and reviewed. Risk factors for depression reviewed and negative. Hearing function and visual acuity are intact. ADLs screened and addressed as needed. Functional ability and level of safety reviewed and appropriate. Education, counseling and referrals performed based on assessed risks today.  Patient provided with a copy of personalized plan for preventive services.  Obesity Weight gain post-Wegovy  discontinuation. Considering future oral weight loss options. - Encouraged increased physical activity. - Discussed potential future use of oral weight loss medications.  Hyperlipidemia Concern due to weight gain and family history. Previous levels elevated. - Ordered lipid panel.  Insomnia Difficulty sleeping despite trazodone . Anxiety may contribute. - Prescribed trazodone  75 mg at bedtime. - Consider magnesium glycinate.  Anxiety Increased anxiety affecting sleep and eating. - Discussed anxiety's impact on sleep and eating.  -Declines need for further intervention -- continue to monitor and follow up as needed         Lucie Buttner, PA-C [facility]

## 2024-10-20 ENCOUNTER — Ambulatory Visit: Payer: Self-pay | Admitting: Physician Assistant

## 2024-10-20 LAB — CBC WITH DIFFERENTIAL/PLATELET
Basophils Absolute: 0 K/uL (ref 0.0–0.1)
Basophils Relative: 0.3 % (ref 0.0–3.0)
Eosinophils Absolute: 0.1 K/uL (ref 0.0–0.7)
Eosinophils Relative: 0.9 % (ref 0.0–5.0)
HCT: 41.3 % (ref 36.0–46.0)
Hemoglobin: 13.6 g/dL (ref 12.0–15.0)
Lymphocytes Relative: 28.4 % (ref 12.0–46.0)
Lymphs Abs: 3.2 K/uL (ref 0.7–4.0)
MCHC: 32.9 g/dL (ref 30.0–36.0)
MCV: 85.6 fl (ref 78.0–100.0)
Monocytes Absolute: 0.9 K/uL (ref 0.1–1.0)
Monocytes Relative: 7.9 % (ref 3.0–12.0)
Neutro Abs: 6.9 K/uL (ref 1.4–7.7)
Neutrophils Relative %: 62.5 % (ref 43.0–77.0)
Platelets: 340 K/uL (ref 150.0–400.0)
RBC: 4.82 Mil/uL (ref 3.87–5.11)
RDW: 12.9 % (ref 11.5–15.5)
WBC: 11.1 K/uL — ABNORMAL HIGH (ref 4.0–10.5)

## 2024-10-20 LAB — COMPREHENSIVE METABOLIC PANEL WITH GFR
ALT: 16 U/L (ref 0–35)
AST: 14 U/L (ref 0–37)
Albumin: 3.8 g/dL (ref 3.5–5.2)
Alkaline Phosphatase: 71 U/L (ref 39–117)
BUN: 12 mg/dL (ref 6–23)
CO2: 27 meq/L (ref 19–32)
Calcium: 8.8 mg/dL (ref 8.4–10.5)
Chloride: 104 meq/L (ref 96–112)
Creatinine, Ser: 0.92 mg/dL (ref 0.40–1.20)
GFR: 81.27 mL/min (ref >60.00)
Glucose, Bld: 75 mg/dL (ref 70–99)
Potassium: 3.7 meq/L (ref 3.5–5.1)
Sodium: 138 meq/L (ref 135–145)
Total Bilirubin: 0.6 mg/dL (ref 0.2–1.2)
Total Protein: 7.2 g/dL (ref 6.0–8.3)

## 2024-10-20 LAB — LIPID PANEL
Cholesterol: 232 mg/dL — ABNORMAL HIGH (ref 0–200)
HDL: 62.7 mg/dL (ref >39.00)
LDL Cholesterol: 154 mg/dL — ABNORMAL HIGH (ref 0–99)
NonHDL: 169.73
Total CHOL/HDL Ratio: 4
Triglycerides: 80 mg/dL (ref 0.0–149.0)
VLDL: 16 mg/dL (ref 0.0–40.0)

## 2024-10-20 LAB — HEMOGLOBIN A1C: Hgb A1c MFr Bld: 5.2 % (ref 4.6–6.5)

## 2024-12-22 ENCOUNTER — Encounter: Admitting: Physician Assistant

## 2024-12-22 ENCOUNTER — Ambulatory Visit: Admitting: Physician Assistant

## 2024-12-23 ENCOUNTER — Ambulatory Visit: Admitting: Nurse Practitioner

## 2024-12-29 ENCOUNTER — Ambulatory Visit: Admitting: Nurse Practitioner

## 2024-12-30 ENCOUNTER — Ambulatory Visit: Admitting: Physician Assistant
